# Patient Record
Sex: Male | Born: 1965 | Race: White | Hispanic: No | Marital: Married | Smoking: Current every day smoker
Health system: Southern US, Community
[De-identification: ages and names within clinical notes are randomized; demographics above are authoritative.]

## PROBLEM LIST (undated history)

## (undated) DIAGNOSIS — K219 Gastro-esophageal reflux disease without esophagitis: Secondary | ICD-10-CM

## (undated) DIAGNOSIS — E785 Hyperlipidemia, unspecified: Secondary | ICD-10-CM

## (undated) DIAGNOSIS — I10 Essential (primary) hypertension: Secondary | ICD-10-CM

## (undated) HISTORY — PX: HERNIA REPAIR: SHX51

## (undated) HISTORY — PX: TONSILLECTOMY: SHX5217

## (undated) HISTORY — DX: Gastro-esophageal reflux disease without esophagitis: K21.9

## (undated) HISTORY — PX: CATARACT EXTRACTION: SUR2

## (undated) HISTORY — DX: Hyperlipidemia, unspecified: E78.5

---

## 2009-05-02 ENCOUNTER — Ambulatory Visit (HOSPITAL_BASED_OUTPATIENT_CLINIC_OR_DEPARTMENT_OTHER): Admission: RE | Admit: 2009-05-02 | Discharge: 2009-05-02 | Payer: Self-pay | Admitting: Surgery

## 2009-12-01 ENCOUNTER — Ambulatory Visit (HOSPITAL_COMMUNITY): Admission: RE | Admit: 2009-12-01 | Discharge: 2009-12-01 | Payer: Self-pay | Admitting: Surgery

## 2009-12-01 HISTORY — PX: UMBILICAL HERNIA REPAIR: SHX196

## 2010-04-26 LAB — DIFFERENTIAL
Basophils Absolute: 0 10*3/uL (ref 0.0–0.1)
Basophils Relative: 0 % (ref 0–1)
Eosinophils Absolute: 0.4 10*3/uL (ref 0.0–0.7)
Monocytes Relative: 8 % (ref 3–12)
Neutrophils Relative %: 60 % (ref 43–77)

## 2010-04-26 LAB — BASIC METABOLIC PANEL
BUN: 10 mg/dL (ref 6–23)
CO2: 25 mEq/L (ref 19–32)
Calcium: 9.5 mg/dL (ref 8.4–10.5)
Creatinine, Ser: 0.92 mg/dL (ref 0.4–1.5)
GFR calc non Af Amer: >60 mL/min (ref >60)
Glucose, Bld: 105 mg/dL — ABNORMAL HIGH (ref 70–99)
Sodium: 134 mEq/L — ABNORMAL LOW (ref 135–145)

## 2010-04-26 LAB — CBC
Hemoglobin: 14.2 g/dL (ref 13.0–17.0)
MCH: 33 pg (ref 26.0–34.0)
MCHC: 35.1 g/dL (ref 30.0–36.0)
RDW: 12.6 % (ref 11.5–15.5)

## 2010-04-26 LAB — SURGICAL PCR SCREEN: MRSA, PCR: NEGATIVE

## 2010-05-07 LAB — BASIC METABOLIC PANEL
BUN: 11 mg/dL (ref 6–23)
CO2: 25 mEq/L (ref 19–32)
Calcium: 9.5 mg/dL (ref 8.4–10.5)
Chloride: 102 mEq/L (ref 96–112)
Creatinine, Ser: 0.9 mg/dL (ref 0.4–1.5)

## 2010-05-07 LAB — CBC
MCHC: 34.8 g/dL (ref 30.0–36.0)
MCV: 95.1 fL (ref 78.0–100.0)
Platelets: 240 10*3/uL (ref 150–400)
WBC: 6.6 10*3/uL (ref 4.0–10.5)

## 2010-05-07 LAB — DIFFERENTIAL
Basophils Relative: 1 % (ref 0–1)
Eosinophils Absolute: 0.9 10*3/uL — ABNORMAL HIGH (ref 0.0–0.7)
Neutrophils Relative %: 41 % — ABNORMAL LOW (ref 43–77)

## 2010-09-12 ENCOUNTER — Encounter (INDEPENDENT_AMBULATORY_CARE_PROVIDER_SITE_OTHER): Payer: Self-pay | Admitting: Surgery

## 2010-09-14 ENCOUNTER — Encounter (INDEPENDENT_AMBULATORY_CARE_PROVIDER_SITE_OTHER): Payer: Self-pay | Admitting: Surgery

## 2010-09-14 ENCOUNTER — Ambulatory Visit (INDEPENDENT_AMBULATORY_CARE_PROVIDER_SITE_OTHER): Payer: BC Managed Care – PPO | Admitting: Surgery

## 2010-09-14 VITALS — BP 152/102 | HR 68 | Temp 97.4°F | Ht 70.0 in | Wt 180.0 lb

## 2010-09-14 DIAGNOSIS — Z9889 Other specified postprocedural states: Secondary | ICD-10-CM

## 2010-09-14 DIAGNOSIS — Z8719 Personal history of other diseases of the digestive system: Secondary | ICD-10-CM

## 2010-09-14 DIAGNOSIS — K4090 Unilateral inguinal hernia, without obstruction or gangrene, not specified as recurrent: Secondary | ICD-10-CM | POA: Insufficient documentation

## 2010-09-14 NOTE — Progress Notes (Signed)
Status post bilateral inguinal hernia repairs with mesh and umbilical hernia repair with mesh in 2011. He did have a recurrence on the left which was repaired in November. The patient has been doing well. He comes in today concerned about some soreness that he is feeling in his right groin. This tends to occur after a long at work with a lot of heavy lifting. He denies any bulge.  On examination his umbilical incision is well healed with no sign of recurrent hernia. Right inguinal incision is well-healed with no sign of recurrent hernia with the patient standing and with Valsalva maneuver A left inguinal incision is well-healed with no sign of recurrent hernia with patient standing and with Valsalva maneuver  Impression: No sign of recurrent hernias Plan: Resume full activity. He may use p.r.n. ibuprofen or Aleve. Followup on a p.r.n. basis.

## 2011-05-26 ENCOUNTER — Emergency Department (HOSPITAL_COMMUNITY)
Admission: EM | Admit: 2011-05-26 | Discharge: 2011-05-27 | Disposition: A | Discharge status: Home / Self Care | Payer: BC Managed Care – PPO | Attending: Emergency Medicine | Admitting: Emergency Medicine

## 2011-05-26 ENCOUNTER — Encounter (HOSPITAL_COMMUNITY): Payer: Self-pay | Admitting: *Deleted

## 2011-05-26 DIAGNOSIS — X503XXA Overexertion from repetitive movements, initial encounter: Secondary | ICD-10-CM | POA: Insufficient documentation

## 2011-05-26 DIAGNOSIS — E785 Hyperlipidemia, unspecified: Secondary | ICD-10-CM | POA: Insufficient documentation

## 2011-05-26 DIAGNOSIS — Z79899 Other long term (current) drug therapy: Secondary | ICD-10-CM | POA: Insufficient documentation

## 2011-05-26 DIAGNOSIS — K219 Gastro-esophageal reflux disease without esophagitis: Secondary | ICD-10-CM | POA: Insufficient documentation

## 2011-05-26 DIAGNOSIS — R1031 Right lower quadrant pain: Secondary | ICD-10-CM | POA: Insufficient documentation

## 2011-05-26 DIAGNOSIS — J45909 Unspecified asthma, uncomplicated: Secondary | ICD-10-CM | POA: Insufficient documentation

## 2011-05-26 DIAGNOSIS — S301XXA Contusion of abdominal wall, initial encounter: Secondary | ICD-10-CM

## 2011-05-26 LAB — DIFFERENTIAL
Basophils Absolute: 0.1 10*3/uL (ref 0.0–0.1)
Eosinophils Relative: 3 % (ref 0–5)
Lymphocytes Relative: 19 % (ref 12–46)
Neutro Abs: 10.1 10*3/uL — ABNORMAL HIGH (ref 1.7–7.7)
Neutrophils Relative %: 73 % (ref 43–77)

## 2011-05-26 LAB — CBC
Platelets: 275 10*3/uL (ref 150–400)
RDW: 12.2 % (ref 11.5–15.5)
WBC: 13.8 10*3/uL — ABNORMAL HIGH (ref 4.0–10.5)

## 2011-05-26 LAB — COMPREHENSIVE METABOLIC PANEL
ALT: 16 U/L (ref 0–53)
AST: 33 U/L (ref 0–37)
CO2: 23 mEq/L (ref 19–32)
Calcium: 9.3 mg/dL (ref 8.4–10.5)
GFR calc non Af Amer: >90 mL/min (ref >90)
Sodium: 127 mEq/L — ABNORMAL LOW (ref 135–145)

## 2011-05-26 LAB — URINALYSIS, ROUTINE W REFLEX MICROSCOPIC
Leukocytes, UA: NEGATIVE
Nitrite: NEGATIVE
Protein, ur: NEGATIVE mg/dL
Urobilinogen, UA: 0.2 mg/dL (ref 0.0–1.0)

## 2011-05-26 MED ORDER — SODIUM CHLORIDE 0.9 % IV BOLUS (SEPSIS)
1000.0000 mL | Freq: Once | INTRAVENOUS | Status: AC
  Administered 2011-05-27: 1000 mL via INTRAVENOUS

## 2011-05-26 MED ORDER — SODIUM CHLORIDE 0.9 % IV SOLN
Freq: Once | INTRAVENOUS | Status: AC
  Administered 2011-05-27: via INTRAVENOUS

## 2011-05-26 NOTE — ED Provider Notes (Addendum)
History     CSN: [id number]  Arrival date & time 05/26/11  2243   First MD Initiated Contact with Patient 05/26/11 2320      Chief Complaint  Patient presents with  . Abdominal Pain    (Consider location/radiation/quality/duration/timing/severity/associated sxs/prior treatment) HPI Comments: Patient presents with right lower quadrant.  Umbilical pain that began on Thursday or Friday.  Patient notes that it has gradually worsened since that time.  He's had no associated nausea, vomiting or changes in bowel habits.  No anorexia.  He notes that it does get worse with physical exertion.  Patient has to do a lot of lifting and physical activity work and he noticed that it was more painful today.  No fevers.  He does have a prior abdominal surgery for a hernia.  No dysuria or hematuria.  No history of kidney stones.  Nothing specifically makes the pain better.  Patient is a 46 y.o. male presenting with abdominal pain. The history is provided by the patient. No language interpreter was used.  Abdominal Pain The primary symptoms of the illness include abdominal pain. The primary symptoms of the illness do not include fever, fatigue, shortness of breath, nausea, vomiting, diarrhea, hematemesis, hematochezia or dysuria. The current episode started more than 2 days ago. The onset of the illness was gradual. The problem has been gradually worsening.  The patient states that she believes she is currently not pregnant. The patient has not had a change in bowel habit. Symptoms associated with the illness do not include chills, anorexia, diaphoresis, heartburn, constipation, urgency, hematuria, frequency or back pain. Significant associated medical issues do not include diabetes.    Past Medical History  Diagnosis Date  . Hyperlipidemia   . GERD (gastroesophageal reflux disease)   . Asthma   . Cataract   . Inguinal hernia     bilateral    Past Surgical History  Procedure Date  . Umbilical hernia  repair 12/01/2009    recurrent left inguinal hernia  . Tonsillectomy   . Hernia repair     inguinal bilateral    History reviewed. No pertinent family history.  History  Substance Use Topics  . Smoking status: Current Everyday Smoker -- 0.5 packs/day  . Smokeless tobacco: Not on file  . Alcohol Use: 10.8 oz/week    18 Cans of beer per week      Review of Systems  Constitutional: Negative.  Negative for fever, chills, diaphoresis and fatigue.  HENT: Negative.   Eyes: Negative.  Negative for discharge and redness.  Respiratory: Negative.  Negative for cough and shortness of breath.   Cardiovascular: Negative.  Negative for chest pain.  Gastrointestinal: Positive for abdominal pain. Negative for heartburn, nausea, vomiting, diarrhea, constipation, hematochezia, anorexia and hematemesis.  Genitourinary: Negative.  Negative for dysuria, urgency, frequency and hematuria.  Musculoskeletal: Negative.  Negative for back pain.  Skin: Negative.  Negative for color change and rash.  Neurological: Negative for syncope and headaches.  Hematological: Negative.  Negative for adenopathy.  Psychiatric/Behavioral: Negative.  Negative for confusion.  All other systems reviewed and are negative.    Allergies  Review of patient's allergies indicates no known allergies.  Home Medications   Current Outpatient Rx  Name Route Sig Dispense Refill  . ALBUTEROL SULFATE HFA 108 (90 BASE) MCG/ACT IN AERS Inhalation Inhale 2 puffs into the lungs every 6 (six) hours as needed. For shortness of breath    . LIPITOR PO Oral Take by mouth daily.     Marland Kitchen  IBUPROFEN 200 MG PO TABS Oral Take 1,200 mg by mouth daily.    Marland Kitchen LISINOPRIL PO Oral Take 1 tablet by mouth daily.      BP 144/86  Pulse 94  Temp(Src) 98.3 F (36.8 C) (Oral)  Resp 16  SpO2 100%  Physical Exam  Nursing note and vitals reviewed. Constitutional: He is oriented to person, place, and time. He appears well-developed and well-nourished.   Non-toxic appearance. He does not have a sickly appearance.  HENT:  Head: Normocephalic and atraumatic.  Eyes: Conjunctivae, EOM and lids are normal. Pupils are equal, round, and reactive to light.  Neck: Trachea normal, normal range of motion and full passive range of motion without pain. Neck supple.  Cardiovascular: Normal rate, regular rhythm and normal heart sounds.   Pulmonary/Chest: Effort normal and breath sounds normal. No respiratory distress. He has no wheezes. He has no rales.  Abdominal: Soft. Normal appearance. He exhibits no distension. There is tenderness. There is no rebound and no CVA tenderness.       Periumbilical tenderness without rebound or guarding.  Musculoskeletal: Normal range of motion.  Neurological: He is alert and oriented to person, place, and time. He has normal strength.  Skin: Skin is warm, dry and intact. No rash noted.  Psychiatric: He has a normal mood and affect. His behavior is normal. Judgment and thought content normal.    ED Course  Procedures (including critical care time)  Results for orders placed during the hospital encounter of 05/26/11  URINALYSIS, ROUTINE W REFLEX MICROSCOPIC      Component Value Range   Color, Urine YELLOW  YELLOW    APPearance CLOUDY (*) CLEAR    Specific Gravity, Urine 1.013  1.005 - 1.030    pH 6.5  5.0 - 8.0    Glucose, UA NEGATIVE  NEGATIVE (mg/dL)   Hgb urine dipstick NEGATIVE  NEGATIVE    Bilirubin Urine NEGATIVE  NEGATIVE    Ketones, ur NEGATIVE  NEGATIVE (mg/dL)   Protein, ur NEGATIVE  NEGATIVE (mg/dL)   Urobilinogen, UA 0.2  0.0 - 1.0 (mg/dL)   Nitrite NEGATIVE  NEGATIVE    Leukocytes, UA NEGATIVE  NEGATIVE   CBC      Component Value Range   WBC 13.8 (*) 4.0 - 10.5 (K/uL)   RBC 4.12 (*) 4.22 - 5.81 (MIL/uL)   Hemoglobin 13.5  13.0 - 17.0 (g/dL)   HCT 16.1 (*) 09.6 - 52.0 (%)   MCV 91.7  78.0 - 100.0 (fL)   MCH 32.8  26.0 - 34.0 (pg)   MCHC 35.7  30.0 - 36.0 (g/dL)   RDW 04.5  40.9 - 81.1 (%)    Platelets 275  150 - 400 (K/uL)  DIFFERENTIAL      Component Value Range   Neutrophils Relative 73  43 - 77 (%)   Neutro Abs 10.1 (*) 1.7 - 7.7 (K/uL)   Lymphocytes Relative 19  12 - 46 (%)   Lymphs Abs 2.7  0.7 - 4.0 (K/uL)   Monocytes Relative 5  3 - 12 (%)   Monocytes Absolute 0.7  0.1 - 1.0 (K/uL)   Eosinophils Relative 3  0 - 5 (%)   Eosinophils Absolute 0.4  0.0 - 0.7 (K/uL)   Basophils Relative 1  0 - 1 (%)   Basophils Absolute 0.1  0.0 - 0.1 (K/uL)  COMPREHENSIVE METABOLIC PANEL      Component Value Range   Sodium 127 (*) 135 - 145 (mEq/L)   Potassium 3.7  3.5 - 5.1 (mEq/L)   Chloride 93 (*) 96 - 112 (mEq/L)   CO2 23  19 - 32 (mEq/L)   Glucose, Bld 114 (*) 70 - 99 (mg/dL)   BUN 11  6 - 23 (mg/dL)   Creatinine, Ser 1.61  0.50 - 1.35 (mg/dL)   Calcium 9.3  8.4 - 09.6 (mg/dL)   Total Protein 7.4  6.0 - 8.3 (g/dL)   Albumin 4.5  3.5 - 5.2 (g/dL)   AST 33  0 - 37 (U/L)   ALT 16  0 - 53 (U/L)   Alkaline Phosphatase 54  39 - 117 (U/L)   Total Bilirubin 0.5  0.3 - 1.2 (mg/dL)   GFR calc non Af Amer >90  >90 (mL/min)   GFR calc Af Amer >90  >90 (mL/min)   Ct Abdomen Pelvis W Contrast  05/27/2011  *RADIOLOGY REPORT*  Clinical Data: Right-sided abdominal pain.  CT ABDOMEN AND PELVIS WITH CONTRAST  Technique:  Multidetector CT imaging of the abdomen and pelvis was performed following the standard protocol during bolus administration of intravenous contrast.  Contrast: 40mL OMNIPAQUE IOHEXOL 300 MG/ML  SOLN, 80mL OMNIPAQUE IOHEXOL 300 MG/ML  SOLN  Comparison: None.  Findings: No acute findings at the lung bases.  Coronary artery atherosclerotic calcification is visualized. A smoothly marginated oval mass adjacent to the right heart border measures 3.5 x 2.2 cm axial dimension and measures water density.  This is most compatible with a pericardial cyst.  Visualized portion of the thoracic aorta is normal in caliber.  There is a focal mixed density, but predominately increased density mass  in the infraumbilical right rectus abdominus musculature. This measures up to approximately 65 HU and is fusiform.  This is consistent with a rectus abdominus muscle hematoma, and measures approximately 2.8 AP x 5.5 transverse  x 11 cm craniocaudal dimension. There is some extraperitoneal stranding deep to the hematoma.  No intraperitoneal blood or stranding is seen.  The liver, gallbladder, spleen, adrenal glands, kidneys, and pancreas are within normal limits.  The abdominal aorta is normal in caliber contains scattered atherosclerotic calcification.  The urinary bladder and prostate gland within normal limits.  The appendix is normal.  The stomach, small bowel loops, and colon are within normal limits.  No evidence of bowel wall thickening. There is no lymphadenopathy or free intraperitoneal air.  No acute bony abnormalities identified.  IMPRESSION:  1. Right rectus abdominus muscle hematoma.  Findings were discussed with Dr. Roseanne Reno by telephone 12:48 05/27/2011. 2.  Normal appendix. 3.  Coronary artery atherosclerotic calcification. 4.  Right pericardial cyst.  Original Report Authenticated By: Britta Mccreedy, M.D.      MDM  Patient with periumbilical and right lower quadrant pain for the last 3 days.  He notes it's been getting worse and does have tenderness on exam as well.  Given the patient's leukocytosis I will obtain a CT of his abdomen and pelvis to rule out appendicitis.  Nat Christen, MD 05/26/11 2337  The patient's CT scan demonstrates a rectus abdominal muscle hematoma.  Patient is hemodynamically stable here.  He is not on any anticoagulation or antiplatelet agents.  Patient has a normal hemoglobin as well.  Anticipate the patient will likely be able to be discharged as there does not appear to be active extravasation at this point in time but I will discuss the patient with general surgery for any further recommendations and possible followup as an outpatient.  Nat Christen,  MD 05/27/11 [phone]  Patient discussed with Dr. Dwain Sarna degrees patient needs conservative management.  No specific followup is needed.  He should minimize his lifting over the next few weeks.  I will discharge the patient home with instructions for light duty.  He can also use ice and pain medications to assist with his symptoms.  Nat Christen, MD 05/27/11 [phone]

## 2011-05-26 NOTE — ED Notes (Signed)
The pt has had rlq pain for 2-3 days no nv or diarrhea

## 2011-05-27 ENCOUNTER — Emergency Department (HOSPITAL_COMMUNITY): Payer: BC Managed Care – PPO

## 2011-05-27 MED ORDER — IOHEXOL 300 MG/ML  SOLN
40.0000 mL | Freq: Once | INTRAMUSCULAR | Status: AC | PRN
  Administered 2011-05-27: 40 mL via ORAL

## 2011-05-27 MED ORDER — HYDROCODONE-ACETAMINOPHEN 5-325 MG PO TABS: 1.0000 | ORAL_TABLET | ORAL | Status: AC | PRN

## 2011-05-27 MED ORDER — IOHEXOL 300 MG/ML  SOLN
80.0000 mL | Freq: Once | INTRAMUSCULAR | Status: AC | PRN
  Administered 2011-05-27: 80 mL via INTRAVENOUS

## 2011-05-27 NOTE — Discharge Instructions (Signed)
You have a rectus abdominal muscle hematoma.  You may place ice on your abdomen to assist with the pain.  You should minimize heavy lifting over the next 2-3 weeks.  The hematoma will decrease over time but it may take several weeks.  If you begin to have worsening pain, swelling or lightheadedness please seek reevaluation.  Hematoma A hematoma is a pocket of blood that collects under the skin, in an organ, in a body space, in a joint space, or in other tissue. The blood can clot to form a lump that you can see and feel. The lump is often firm, sore, and sometimes even painful and tender. Most hematomas get better in a few days to weeks. However, some hematomas may be serious and require medical care.Hematomas can range in size from very small to very large. CAUSES  A hematoma can be caused by a blunt or penetrating injury. It can also be caused by leakage from a blood vessel under the skin. Spontaneous leakage from a blood vessel is more likely to occur in elderly people, especially those taking blood thinners. Sometimes, a hematoma can develop after certain medical procedures. SYMPTOMS  Unlike a bruise, a hematoma forms a firm lump that you can feel. This lump is the collection of blood. The collection of blood can also cause your skin to turn a blue to dark blue color. If the hematoma is close to the surface of the skin, it often produces a yellowish color in the skin. DIAGNOSIS  Your caregiver can determine whether you have a hematoma based on your history and a physical exam. TREATMENT  Hematomas usually go away on their own over time. Rarely does the blood need to be drained out of the body. HOME CARE INSTRUCTIONS   Put ice on the injured area.   Put ice in a plastic bag.   Place a towel between your skin and the bag.   Leave the ice on for 15 to 20 minutes, 3 to 4 times a day for the first 1 to 2 days.   After the first 2 days, switch to using warm compresses on the hematoma.    Elevate the injured area to help decrease pain and swelling. Wrapping the area with an elastic bandage may also be helpful. Compression helps to reduce swelling and promotes shrinking of the hematoma. Make sure the bandage is not wrapped too tight.   If your hematoma is on a lower extremity and is painful, crutches may be helpful for a couple days.   Only take over-the-counter or prescription medicines for pain, discomfort, or fever as directed by your caregiver. Most patients can take acetaminophen or ibuprofen for the pain.  SEEK IMMEDIATE MEDICAL CARE IF:   You have increasing pain, or your pain is not controlled with medicine.   You have a fever.   You have worsening swelling or discoloration.   Your skin over the hematoma breaks or starts bleeding.  MAKE SURE YOU:   Understand these instructions.   Will watch your condition.   Will get help right away if you are not doing well or get worse.  Document Released: 09/12/2003 Document Revised: 01/17/2011 Document Reviewed: 10/01/2010 [facility] Patient Information 2012 [place], [place].

## 2011-08-02 ENCOUNTER — Encounter (INDEPENDENT_AMBULATORY_CARE_PROVIDER_SITE_OTHER): Payer: BC Managed Care – PPO | Admitting: Surgery

## 2012-12-04 ENCOUNTER — Encounter (INDEPENDENT_AMBULATORY_CARE_PROVIDER_SITE_OTHER): Payer: BC Managed Care – PPO | Admitting: Surgery

## 2013-03-08 ENCOUNTER — Ambulatory Visit (INDEPENDENT_AMBULATORY_CARE_PROVIDER_SITE_OTHER): Payer: BC Managed Care – PPO | Admitting: Surgery

## 2013-03-08 ENCOUNTER — Encounter (INDEPENDENT_AMBULATORY_CARE_PROVIDER_SITE_OTHER): Payer: Self-pay | Admitting: Surgery

## 2013-03-08 ENCOUNTER — Telehealth (INDEPENDENT_AMBULATORY_CARE_PROVIDER_SITE_OTHER): Payer: Self-pay | Admitting: General Surgery

## 2013-03-08 ENCOUNTER — Encounter (INDEPENDENT_AMBULATORY_CARE_PROVIDER_SITE_OTHER): Payer: Self-pay

## 2013-03-08 VITALS — BP 116/80 | HR 72 | Temp 97.8°F | Resp 14 | Ht 70.0 in | Wt 180.4 lb

## 2013-03-08 DIAGNOSIS — R1032 Left lower quadrant pain: Secondary | ICD-10-CM

## 2013-03-08 DIAGNOSIS — R109 Unspecified abdominal pain: Secondary | ICD-10-CM

## 2013-03-08 NOTE — Telephone Encounter (Signed)
Called back and spoke to the wife and I told her that Mr. Xavier Cooper has to see his PCP for something to claim him down when he has the test, that we don't do that here

## 2013-03-08 NOTE — Progress Notes (Signed)
Patient ID: Xavier Cooper, male   DOB: 1965/03/31, 48 y.o.   MRN: [id number]  Chief Complaint  Patient presents with  . New Evaluation    eval of abd wall hernia    HPI Xavier Cooper is a 48 y.o. male.  Self-referred for left groin pain  HPI This is a 48 year old male who presented in 2011 with bilateral inguinal hernias. These were repaired in open fashion with ultra pro mesh. Shortly after his operation he was working on his wife's car when he developed pain in his left groin. This area became larger and he had an obvious recurrence at his left groin. He also had a small umbilical hernia. In October of 2011 he underwent open repair of the recurrent left hernia as well as an umbilical hernia. He had indirect recurrence which was repaired by reducing the hernia sac and tightening the mesh around the spermatic cord. The umbilical hernia was primarily repaired. The patient has been doing well and has not been seen for over 3 years. About 4 months ago he began having some pain in his left groin. He reports no obvious bulge in this area but does feel discomfort after he has been standing on his feet for some time. He denies any obstructive symptoms.   Past Medical History  Diagnosis Date  . Hyperlipidemia   . GERD (gastroesophageal reflux disease)   . Asthma   . Cataract   . Inguinal hernia     bilateral    Past Surgical History  Procedure Laterality Date  . Umbilical hernia repair  12/01/2009    recurrent left inguinal hernia  . Tonsillectomy    . Hernia repair      inguinal bilateral  . Cataract extraction      History reviewed. No pertinent family history.  Social History History  Substance Use Topics  . Smoking status: Current Every Day Smoker -- 0.50 packs/day  . Smokeless tobacco: Never Used  . Alcohol Use: 10.8 oz/week    18 Cans of beer per week    No Known Allergies  Current Outpatient Prescriptions  Medication Sig Dispense Refill  . Atorvastatin Calcium  (LIPITOR PO) Take by mouth daily.       Marland Kitchen ibuprofen (ADVIL,MOTRIN) 200 MG tablet Take 1,200 mg by mouth daily.      Marland Kitchen LISINOPRIL PO Take 1 tablet by mouth daily.      Marland Kitchen albuterol (PROVENTIL HFA;VENTOLIN HFA) 108 (90 BASE) MCG/ACT inhaler Inhale 2 puffs into the lungs every 6 (six) hours as needed. For shortness of breath       No current facility-administered medications for this visit.    Review of Systems Review of Systems  Constitutional: Negative for fever, chills and unexpected weight change.  HENT: Negative for congestion, hearing loss, sore throat, trouble swallowing and voice change.   Eyes: Negative for visual disturbance.  Respiratory: Negative for cough and wheezing.   Cardiovascular: Negative for chest pain, palpitations and leg swelling.  Gastrointestinal: Positive for abdominal pain. Negative for nausea, vomiting, diarrhea, constipation, blood in stool, abdominal distention, anal bleeding and rectal pain.  Genitourinary: Positive for testicular pain. Negative for hematuria and difficulty urinating.  Musculoskeletal: Negative for arthralgias.  Skin: Negative for rash and wound.  Neurological: Negative for seizures, syncope, weakness and headaches.  Hematological: Negative for adenopathy. Does not bruise/bleed easily.  Psychiatric/Behavioral: Negative for confusion.    Blood pressure 116/80, pulse 72, temperature 97.8 F (36.6 C), temperature source Temporal, resp. rate 14, height 5'  10" (1.778 m), weight 180 lb 6.4 oz (81.829 kg).  Physical Exam Physical Exam WDWN in NAD GU:  Bilateral descended testes; no testicular masses; well-healed incisions; no obvious recurrent hernia on either side The area of tenderness is lateral to his incision on the left.  No obvious bulge Data Reviewed none  Assessment    Left groin pain - possible small recurrence of left inguinal hernia, but not obvious on examination     Plan    CT scan pelvis to evaluate for recurrence.  We  will see him back after the CT scan has been completed.        Cathern Tahir K. 03/08/2013, 1:43 PM

## 2013-03-08 NOTE — Telephone Encounter (Signed)
Message copied by Wilder GladeMCPHERSON, Malyssa Maris on Mon Mar 08, 2013  4:35 PM ------      Message from: Rise PaganiniFRYER, SHAPELL      Created: Mon Mar 08, 2013  4:23 PM      Regarding: Tsuei      Contact: [phone]       Patient was told to have a scan. He is unable to lie still for that amount of time. He will need something to help him during the scan. Please call to discuss. You can reach him at the home number also [phone]. Thank you. ------

## 2013-03-12 ENCOUNTER — Ambulatory Visit
Admission: RE | Admit: 2013-03-12 | Discharge: 2013-03-12 | Disposition: A | Discharge status: Home / Self Care | Payer: BC Managed Care – PPO | Source: Ambulatory Visit | Attending: Surgery | Admitting: Surgery

## 2013-03-12 DIAGNOSIS — R1032 Left lower quadrant pain: Secondary | ICD-10-CM

## 2013-03-12 MED ORDER — IOHEXOL 300 MG/ML  SOLN
100.0000 mL | Freq: Once | INTRAMUSCULAR | Status: AC | PRN
  Administered 2013-03-12: 100 mL via INTRAVENOUS

## 2013-03-19 ENCOUNTER — Encounter (INDEPENDENT_AMBULATORY_CARE_PROVIDER_SITE_OTHER): Payer: BC Managed Care – PPO | Admitting: Surgery

## 2013-04-05 ENCOUNTER — Ambulatory Visit
Admission: RE | Admit: 2013-04-05 | Discharge: 2013-04-05 | Disposition: A | Discharge status: Home / Self Care | Payer: BC Managed Care – PPO | Source: Ambulatory Visit | Attending: Internal Medicine | Admitting: Internal Medicine

## 2013-04-05 ENCOUNTER — Other Ambulatory Visit: Payer: Self-pay | Admitting: Internal Medicine

## 2013-04-05 DIAGNOSIS — R109 Unspecified abdominal pain: Secondary | ICD-10-CM

## 2013-04-16 ENCOUNTER — Encounter (INDEPENDENT_AMBULATORY_CARE_PROVIDER_SITE_OTHER): Payer: BC Managed Care – PPO | Admitting: Surgery

## 2013-06-04 ENCOUNTER — Encounter (INDEPENDENT_AMBULATORY_CARE_PROVIDER_SITE_OTHER): Payer: Self-pay | Admitting: Surgery

## 2013-06-04 ENCOUNTER — Ambulatory Visit (INDEPENDENT_AMBULATORY_CARE_PROVIDER_SITE_OTHER): Payer: BC Managed Care – PPO | Admitting: Surgery

## 2013-06-04 VITALS — BP 130/85 | HR 81 | Temp 98.6°F | Resp 16 | Ht 70.0 in | Wt 178.4 lb

## 2013-06-04 DIAGNOSIS — R1032 Left lower quadrant pain: Secondary | ICD-10-CM

## 2013-06-04 DIAGNOSIS — R109 Unspecified abdominal pain: Secondary | ICD-10-CM

## 2013-06-04 NOTE — Progress Notes (Signed)
Follow-up of his left groin pain after hernia repair in 2011.  The pain in his left groin has improved, but last week, he was lifting something heavy at work at felt some pain in his right groin.  No swelling on either side.  He underwent a CT scan of the pelvis on 03/12/13 that showed no sign of recurrent hernia.    CLINICAL DATA: Left groin pain with constipation for 4 months.  History of bilateral inguinal hernia repair in 2011 with recurrent  hernia repair on the left. No recent injury.  EXAM:  CT PELVIS WITH CONTRAST  TECHNIQUE:  Multidetector CT imaging of the pelvis was performed using the  standard protocol following the bolus administration of intravenous  contrast.  CONTRAST: 100mL OMNIPAQUE IOHEXOL 300 MG/ML SOLN  COMPARISON: Pelvic CT 05/27/2011.  FINDINGS:  There are resolving postsurgical changes in both inguinal regions.  No recurrent hernia or focal fluid collection is identified. The  right rectus sheath hematoma demonstrated previously has resolved.  There is an oval low-density mass between the coccyx and the rectum  which measures 4.2 x 3.0 x 5.0 cm. The surrounding fat planes are  intact, and there is no osseous erosion or extension to the  overlying subcutaneous tissues or skin. This appears slightly larger  than on the prior study.  The bladder, prostate gland and seminal vesicles appear stable.  There is no pelvic lymphadenopathy. There is mild distal aortic  atherosclerosis.  IMPRESSION:  1. No evidence of recurrent inguinal hernia or specific explanation  for recurrent left inguinal pain.  2. 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 This demonstrates no  aggressive characteristics or signs of infection although could  contribute to perirectal discomfort.  Electronically Signed  By: Roxy HorsemanBill Veazey M.D.  On: 03/12/2013 15:55      On examination, I cannot palpate a recurrent hernia on either side.  No  swelling or point tenderness noted.  My impression is that he experiences occasional muscle strain where the mesh is attached to the muscle.  No sign of recurrent hernia.  He may use NSAIDS PRN.  Follow-up PRN.  Wilmon ArmsMatthew K. Corliss Skainssuei, MD, [facility] CenterFACS [facility]  General/ Trauma Surgery  06/04/2013 4:46 PM

## 2013-08-10 ENCOUNTER — Emergency Department (HOSPITAL_COMMUNITY): Payer: BC Managed Care – PPO

## 2013-08-10 ENCOUNTER — Emergency Department (HOSPITAL_COMMUNITY)
Admission: EM | Admit: 2013-08-10 | Discharge: 2013-08-10 | Disposition: A | Discharge status: Home / Self Care | Payer: BC Managed Care – PPO | Attending: Emergency Medicine | Admitting: Emergency Medicine

## 2013-08-10 ENCOUNTER — Encounter (HOSPITAL_COMMUNITY): Payer: Self-pay | Admitting: Emergency Medicine

## 2013-08-10 DIAGNOSIS — R0789 Other chest pain: Secondary | ICD-10-CM

## 2013-08-10 DIAGNOSIS — Z8719 Personal history of other diseases of the digestive system: Secondary | ICD-10-CM | POA: Diagnosis not present

## 2013-08-10 DIAGNOSIS — Z8669 Personal history of other diseases of the nervous system and sense organs: Secondary | ICD-10-CM | POA: Insufficient documentation

## 2013-08-10 DIAGNOSIS — J45909 Unspecified asthma, uncomplicated: Secondary | ICD-10-CM | POA: Diagnosis not present

## 2013-08-10 DIAGNOSIS — F17200 Nicotine dependence, unspecified, uncomplicated: Secondary | ICD-10-CM | POA: Diagnosis not present

## 2013-08-10 DIAGNOSIS — Z79899 Other long term (current) drug therapy: Secondary | ICD-10-CM | POA: Diagnosis not present

## 2013-08-10 DIAGNOSIS — E785 Hyperlipidemia, unspecified: Secondary | ICD-10-CM | POA: Diagnosis not present

## 2013-08-10 DIAGNOSIS — R079 Chest pain, unspecified: Secondary | ICD-10-CM | POA: Insufficient documentation

## 2013-08-10 DIAGNOSIS — Z791 Long term (current) use of non-steroidal anti-inflammatories (NSAID): Secondary | ICD-10-CM | POA: Insufficient documentation

## 2013-08-10 LAB — I-STAT TROPONIN, ED: TROPONIN I, POC: 0 ng/mL (ref 0.00–0.08)

## 2013-08-10 LAB — CBC
HEMATOCRIT: 40.3 % (ref 39.0–52.0)
Hemoglobin: 13.7 g/dL (ref 13.0–17.0)
MCH: 31.4 pg (ref 26.0–34.0)
MCHC: 34 g/dL (ref 30.0–36.0)
MCV: 92.4 fL (ref 78.0–100.0)
Platelets: 257 10*3/uL (ref 150–400)
RBC: 4.36 MIL/uL (ref 4.22–5.81)
RDW: 12.1 % (ref 11.5–15.5)
WBC: 9.5 10*3/uL (ref 4.0–10.5)

## 2013-08-10 LAB — BASIC METABOLIC PANEL
BUN: 9 mg/dL (ref 6–23)
CHLORIDE: 92 meq/L — AB (ref 96–112)
CO2: 25 mEq/L (ref 19–32)
Calcium: 9.4 mg/dL (ref 8.4–10.5)
Creatinine, Ser: 0.8 mg/dL (ref 0.50–1.35)
GFR calc Af Amer: >90 mL/min (ref >90)
GFR calc non Af Amer: >90 mL/min (ref >90)
Glucose, Bld: 94 mg/dL (ref 70–99)
POTASSIUM: 4.5 meq/L (ref 3.7–5.3)
Sodium: 131 mEq/L — ABNORMAL LOW (ref 137–147)

## 2013-08-10 LAB — TROPONIN I

## 2013-08-10 MED ORDER — KETOROLAC TROMETHAMINE 30 MG/ML IJ SOLN
30.0000 mg | Freq: Once | INTRAMUSCULAR | Status: AC
  Administered 2013-08-10: 30 mg via INTRAMUSCULAR
  Filled 2013-08-10: qty 1

## 2013-08-10 MED ORDER — KETOROLAC TROMETHAMINE 30 MG/ML IJ SOLN: 30.0000 mg | Freq: Once | INTRAMUSCULAR | Status: DC

## 2013-08-10 NOTE — ED Provider Notes (Signed)
CSN: [id number]     Arrival date & time 08/10/13  1517 History   First MD Initiated Contact with Patient 08/10/13 1712     Chief Complaint  Patient presents with  . Chest Pain     (Consider location/radiation/quality/duration/timing/severity/associated sxs/prior Treatment) HPI Patient presents with chest pain.  Patient notes that he has had similar pain episodes with the past 3 days.  Each episode is left-sided, nonradiating, sore, momentary. There is no concurrent dyspnea, nausea, vomiting, dizziness. There is mild associated weakness and headache. There is no unilateral weakness, dysesthesia, confusion, disorientation. No exertional or pleuritic pain. Patient is generally well   Past Medical History  Diagnosis Date  . Hyperlipidemia   . GERD (gastroesophageal reflux disease)   . Asthma   . Cataract   . Inguinal hernia     bilateral   Past Surgical History  Procedure Laterality Date  . Umbilical hernia repair  12/01/2009    recurrent left inguinal hernia  . Tonsillectomy    . Hernia repair      inguinal bilateral  . Cataract extraction     History reviewed. No pertinent family history. History  Substance Use Topics  . Smoking status: Current Every Day Smoker -- 0.50 packs/day  . Smokeless tobacco: Never Used  . Alcohol Use: 10.8 oz/week    18 Cans of beer per week    Review of Systems  Constitutional:       Per HPI, otherwise negative  HENT:       Per HPI, otherwise negative  Respiratory:       Per HPI, otherwise negative  Cardiovascular:       Per HPI, otherwise negative  Gastrointestinal: Negative for vomiting.  Endocrine:       Negative aside from HPI  Genitourinary:       Neg aside from HPI   Musculoskeletal:       Per HPI, otherwise negative  Skin: Negative.   Neurological: Negative for syncope.      Allergies  Review of patient's allergies indicates no known allergies.  Home Medications   Prior to Admission medications   Medication Sig  Start Date End Date Taking? Authorizing Provider  albuterol (PROVENTIL HFA;VENTOLIN HFA) 108 (90 BASE) MCG/ACT inhaler Inhale 2 puffs into the lungs every 6 (six) hours as needed. For shortness of breath   Yes Historical Provider, MD  Atorvastatin Calcium (LIPITOR PO) Take by mouth daily.    Yes Historical Provider, MD  ibuprofen (ADVIL,MOTRIN) 200 MG tablet Take 1,200 mg by mouth daily.   Yes Historical Provider, MD  LISINOPRIL PO Take 1 tablet by mouth daily.   Yes Historical Provider, MD   BP 129/96  Pulse 74  Temp(Src) 98.6 F (37 C) (Oral)  Resp 15  Wt 178 lb 6 oz (80.91 kg)  SpO2 100% Physical Exam  Nursing note and vitals reviewed. Constitutional: He is oriented to person, place, and time. He appears well-developed. No distress.  HENT:  Head: Normocephalic and atraumatic.  Eyes: Conjunctivae and EOM are normal.  Cardiovascular: Normal rate, regular rhythm and intact distal pulses.   Pulmonary/Chest: Effort normal. No stridor. No respiratory distress.  Abdominal: He exhibits no distension.  Musculoskeletal: He exhibits no edema.  Neurological: He is alert and oriented to person, place, and time. No cranial nerve deficit. He exhibits normal muscle tone. Coordination normal.  Skin: Skin is warm and dry.  Psychiatric: He has a normal mood and affect.    ED Course  Procedures (including critical care  time) Labs Review Labs Reviewed  BASIC METABOLIC PANEL - Abnormal; Notable for the following:    Sodium 131 (*)    Chloride 92 (*)    All other components within normal limits  CBC  I-STAT TROPOININ, ED    Imaging Review Dg Chest 2 View  08/10/2013   CLINICAL DATA:  Left upper chest discomfort with history of asthma  EXAM: CHEST  2 VIEW  COMPARISON:  PA chest. Associated with a rib detail study of April 05, 2013  FINDINGS: The lungs are well-expanded and clear. The hemidiaphragms are not significantly flattened. The heart and pulmonary vascularity are normal. There is no  pleural effusion. There is a stable prominent epicardial fat pad anteriorly to the right of midline. The bony thorax is unremarkable.  IMPRESSION: There is no acute cardiopulmonary disease.   Electronically Signed   By: David  [place]   On: 08/10/2013 16:00     EKG Interpretation   Date/Time:  Tuesday August 10 2013 15:24:59 EDT Ventricular Rate:  81 PR Interval:  190 QRS Duration: 100 QT Interval:  358 QTC Calculation: 415 R Axis:   71 Text Interpretation:  Normal sinus rhythm Minimal voltage criteria for  LVH, may be normal variant Borderline ECG Sinus rhythm Left ventricular  hypertrophy No significant change since last tracing Abnormal ekg  Confirmed by Gerhard MunchLOCKWOOD, ROBERT  MD [phone] on 08/10/2013 5:50:40 PM     Update: On repeat exam the patient is awake alert, no ongoing complaints.  MDM  Patient presents with episodic chest pain for several days.  Absence of concurrent dyspnea, fever, chills is reassuring for the low suspicion of ongoing infection or thromboembolic processes. The patient does smoke, was counseled on the importance of not smoking. With 2 negative troponins, nonischemic EKG, there is low suspicion for occult ACS. Patient was discharged in stable condition with cardiology followup.   Gerhard Munchobert Lockwood, MD 08/10/13 2007

## 2013-08-10 NOTE — ED Notes (Signed)
Pt states that he is in no pain. Pain comes and goes

## 2013-08-10 NOTE — ED Notes (Signed)
Presents with 3 days of non radiating left sided chest pain described as dull ache, intermittent. Nothing makes pain better, nothing makes pain worse. Denies SOB, nausea and dizziness. Reports generalized weakness and headaches. Alert, oriented and MAE x4. Pain rated 3-4/10.

## 2013-08-10 NOTE — Discharge Instructions (Signed)
As discussed, your evaluation today has been largely reassuring.  But, it is important that you monitor your condition carefully, and do not hesitate to return to the ED if you develop new, or concerning changes in your condition. ° °Otherwise, please follow-up with your physician for appropriate ongoing care. ° °Chest Pain (Nonspecific) °It is often hard to give a diagnosis for the cause of chest pain. There is always a chance that your pain could be related to something serious, such as a heart attack or a blood clot in the lungs. You need to follow up with your doctor. °HOME CARE °· If antibiotic medicine was given, take it as directed by your doctor. Finish the medicine even if you start to feel better. °· For the next few days, avoid activities that bring on chest pain. Continue physical activities as told by your doctor. °· Do not use any tobacco products. This includes cigarettes, chewing tobacco, and e-cigarettes. °· Avoid drinking alcohol. °· Only take medicine as told by your doctor. °· Follow your doctor's suggestions for more testing if your chest pain does not go away. °· Keep all doctor visits you made. °GET HELP IF: °· Your chest pain does not go away, even after treatment. °· You have a rash with blisters on your chest. °· You have a fever. °GET HELP RIGHT AWAY IF:  °· You have more pain or pain that spreads to your arm, neck, jaw, back, or belly (abdomen). °· You have shortness of breath. °· You cough more than usual or cough up blood. °· You have very bad back or belly pain. °· You feel sick to your stomach (nauseous) or throw up (vomit). °· You have very bad weakness. °· You pass out (faint). °· You have chills. °This is an emergency. Do not wait to see if the problems will go away. Call your local emergency services (911 in [place] Do not drive yourself to the hospital. °MAKE SURE YOU:  °· Understand these instructions. °· Will watch your condition. °· Will get help right away if you are not doing  well or get worse. °Document Released: 07/17/2007 Document Revised: 02/02/2013 Document Reviewed: 07/17/2007 °ExitCare® Patient Information ©2015 [employer]. This information is not intended to replace advice given to you by your health care provider. Make sure you discuss any questions you have with your health care provider. ° °

## 2013-08-10 NOTE — ED Notes (Signed)
Onset Sunday intermittant left sided chest pain, lasts 15 min and has had 10 times a day since onset.  No other s/s noted with and without chest pain. Pt wants evaluated.

## 2018-05-09 ENCOUNTER — Emergency Department (HOSPITAL_COMMUNITY)
Admission: EM | Admit: 2018-05-09 | Discharge: 2018-05-09 | Disposition: A | Discharge status: Home / Self Care | Payer: BLUE CROSS/BLUE SHIELD | Attending: Emergency Medicine | Admitting: Emergency Medicine

## 2018-05-09 ENCOUNTER — Emergency Department (HOSPITAL_COMMUNITY): Payer: BLUE CROSS/BLUE SHIELD

## 2018-05-09 ENCOUNTER — Other Ambulatory Visit: Payer: Self-pay

## 2018-05-09 ENCOUNTER — Encounter (HOSPITAL_COMMUNITY): Payer: Self-pay | Admitting: Emergency Medicine

## 2018-05-09 DIAGNOSIS — Z79899 Other long term (current) drug therapy: Secondary | ICD-10-CM | POA: Insufficient documentation

## 2018-05-09 DIAGNOSIS — R109 Unspecified abdominal pain: Secondary | ICD-10-CM

## 2018-05-09 DIAGNOSIS — J45909 Unspecified asthma, uncomplicated: Secondary | ICD-10-CM | POA: Insufficient documentation

## 2018-05-09 DIAGNOSIS — F17210 Nicotine dependence, cigarettes, uncomplicated: Secondary | ICD-10-CM | POA: Diagnosis not present

## 2018-05-09 DIAGNOSIS — R1012 Left upper quadrant pain: Secondary | ICD-10-CM | POA: Insufficient documentation

## 2018-05-09 DIAGNOSIS — I10 Essential (primary) hypertension: Secondary | ICD-10-CM | POA: Insufficient documentation

## 2018-05-09 DIAGNOSIS — R0600 Dyspnea, unspecified: Secondary | ICD-10-CM

## 2018-05-09 DIAGNOSIS — R0602 Shortness of breath: Secondary | ICD-10-CM | POA: Diagnosis present

## 2018-05-09 HISTORY — DX: Essential (primary) hypertension: I10

## 2018-05-09 LAB — URINALYSIS, ROUTINE W REFLEX MICROSCOPIC
BACTERIA UA: NONE SEEN
BILIRUBIN URINE: NEGATIVE
Glucose, UA: NEGATIVE mg/dL
Hgb urine dipstick: NEGATIVE
KETONES UR: NEGATIVE mg/dL
LEUKOCYTE UA: NEGATIVE
Nitrite: NEGATIVE
Protein, ur: NEGATIVE mg/dL
SPECIFIC GRAVITY, URINE: 1.001 — AB (ref 1.005–1.030)
pH: 6 (ref 5.0–8.0)

## 2018-05-09 LAB — D-DIMER, QUANTITATIVE: D-Dimer, Quant: 0.52 ug/mL-FEU — ABNORMAL HIGH (ref 0.00–0.50)

## 2018-05-09 LAB — BASIC METABOLIC PANEL
Anion gap: 10 (ref 5–15)
BUN: 8 mg/dL (ref 6–20)
CO2: 22 mmol/L (ref 22–32)
Calcium: 9.3 mg/dL (ref 8.9–10.3)
Chloride: 93 mmol/L — ABNORMAL LOW (ref 98–111)
Creatinine, Ser: 0.74 mg/dL (ref 0.61–1.24)
GFR calc Af Amer: >60 mL/min (ref >60)
Glucose, Bld: 104 mg/dL — ABNORMAL HIGH (ref 70–99)
Potassium: 3.3 mmol/L — ABNORMAL LOW (ref 3.5–5.1)
Sodium: 125 mmol/L — ABNORMAL LOW (ref 135–145)

## 2018-05-09 LAB — CBC WITH DIFFERENTIAL/PLATELET
Abs Immature Granulocytes: 0.02 10*3/uL (ref 0.00–0.07)
Basophils Absolute: 0.1 10*3/uL (ref 0.0–0.1)
Basophils Relative: 1 %
EOS PCT: 4 %
Eosinophils Absolute: 0.4 10*3/uL (ref 0.0–0.5)
HCT: 39.3 % (ref 39.0–52.0)
HEMOGLOBIN: 13.6 g/dL (ref 13.0–17.0)
Immature Granulocytes: 0 %
LYMPHS PCT: 28 %
Lymphs Abs: 3.2 10*3/uL (ref 0.7–4.0)
MCH: 31.7 pg (ref 26.0–34.0)
MCHC: 34.6 g/dL (ref 30.0–36.0)
MCV: 91.6 fL (ref 80.0–100.0)
MONO ABS: 0.8 10*3/uL (ref 0.1–1.0)
MONOS PCT: 7 %
NEUTROS PCT: 60 %
NRBC: 0 % (ref 0.0–0.2)
Neutro Abs: 6.9 10*3/uL (ref 1.7–7.7)
Platelets: 293 10*3/uL (ref 150–400)
RBC: 4.29 MIL/uL (ref 4.22–5.81)
RDW: 11.7 % (ref 11.5–15.5)
WBC: 11.4 10*3/uL — ABNORMAL HIGH (ref 4.0–10.5)

## 2018-05-09 LAB — TROPONIN I: Troponin I: <0.03 ng/mL (ref <0.03)

## 2018-05-09 MED ORDER — IOHEXOL 350 MG/ML SOLN
100.0000 mL | Freq: Once | INTRAVENOUS | Status: AC | PRN
  Administered 2018-05-09: 100 mL via INTRAVENOUS

## 2018-05-09 NOTE — ED Notes (Signed)
Pt ambulatory to waiting room. Pt verbalized understanding of discharge instructions.   

## 2018-05-09 NOTE — ED Provider Notes (Signed)
[facility] EMERGENCY DEPARTMENT Provider Note   CSN: [id number] Arrival date & time: 05/09/18  1927    History   Chief Complaint Chief Complaint  Patient presents with  . Shortness of Breath    HPI Xavier Cooper is a 53 y.o. male.     The history is provided by the patient. No language interpreter was used.  Shortness of Breath  Severity:  Moderate Onset quality:  Gradual Timing:  Intermittent Progression:  Worsening Chronicity:  New Relieved by:  Nothing Worsened by:  Nothing Ineffective treatments:  None tried Associated symptoms: no chest pain   Pt complains of pain in left mid back.  Pt reports also shortness of breath.  Pt reports he has episodes of shortness of breath that come and go.   Past Medical History:  Diagnosis Date  . Asthma   . Cataract   . GERD (gastroesophageal reflux disease)   . Hyperlipidemia   . Hypertension   . Inguinal hernia    bilateral    Patient Active Problem List   Diagnosis Date Noted  . Left groin pain 03/08/2013  . Inguinal hernia     Past Surgical History:  Procedure Laterality Date  . CATARACT EXTRACTION    . HERNIA REPAIR     inguinal bilateral  . TONSILLECTOMY    . UMBILICAL HERNIA REPAIR  12/01/2009   recurrent left inguinal hernia        Home Medications    Prior to Admission medications   Medication Sig Start Date End Date Taking? Authorizing Provider  albuterol (PROVENTIL HFA;VENTOLIN HFA) 108 (90 BASE) MCG/ACT inhaler Inhale 2 puffs into the lungs every 6 (six) hours as needed. For shortness of breath    [provider]  Atorvastatin Calcium (LIPITOR PO) Take by mouth daily.     [provider]  ibuprofen (ADVIL,MOTRIN) 200 MG tablet Take 1,200 mg by mouth daily.    [provider]  LISINOPRIL PO Take 1 tablet by mouth daily.    [provider]    Family History No family history on file.  Social History Social History   Tobacco Use  . Smoking status:  Current Every Day Smoker    Packs/day: 0.50  . Smokeless tobacco: Never Used  Substance Use Topics  . Alcohol use: Yes    Alcohol/week: 6.0 standard drinks    Types: 6 Cans of beer per week    Comment: 6-7 beers per day   . Drug use: Not Currently    Types: Marijuana     Allergies   Patient has no known allergies.   Review of Systems Review of Systems  Respiratory: Positive for shortness of breath.   Cardiovascular: Negative for chest pain.  All other systems reviewed and are negative.    Physical Exam Updated Vital Signs BP (!) 152/99   Pulse 72   Temp 97.7 F (36.5 C) (Oral)   Resp 18   Ht 5\' 10"  (1.778 m)   Wt 79.4 kg   SpO2 100%   BMI 25.11 kg/m   Physical Exam Vitals signs and nursing note reviewed.  Constitutional:      Appearance: He is well-developed.  HENT:     Head: Normocephalic and atraumatic.     Mouth/Throat:     Mouth: Mucous membranes are moist.  Eyes:     Extraocular Movements: Extraocular movements intact.     Conjunctiva/sclera: Conjunctivae normal.     Pupils: Pupils are equal, round, and reactive to light.  Neck:     Musculoskeletal: Normal range of motion and neck supple.  Cardiovascular:     Rate and Rhythm: Normal rate and regular rhythm.     Heart sounds: No murmur.  Pulmonary:     Effort: Pulmonary effort is normal. No respiratory distress.     Breath sounds: Normal breath sounds. No decreased breath sounds.  Abdominal:     Palpations: Abdomen is soft.     Tenderness: There is no abdominal tenderness.  Musculoskeletal: Normal range of motion.  Skin:    General: Skin is warm and dry.  Neurological:     General: No focal deficit present.     Mental Status: He is alert.  Psychiatric:        Mood and Affect: Mood normal.      ED Treatments / Results  Labs (all labs ordered are listed, but only abnormal results are displayed) Labs Reviewed  URINALYSIS, ROUTINE W REFLEX MICROSCOPIC - Abnormal; Notable for the following  components:      Result Value   Color, Urine COLORLESS (*)    Specific Gravity, Urine 1.001 (*)    All other components within normal limits  CBC WITH DIFFERENTIAL/PLATELET - Abnormal; Notable for the following components:   WBC 11.4 (*)    All other components within normal limits  BASIC METABOLIC PANEL - Abnormal; Notable for the following components:   Sodium 125 (*)    Potassium 3.3 (*)    Chloride 93 (*)    Glucose, Bld 104 (*)    All other components within normal limits  D-DIMER, QUANTITATIVE (NOT AT [facility]) - Abnormal; Notable for the following components:   D-Dimer, Quant 0.52 (*)    All other components within normal limits  TROPONIN I    EKG EKG Interpretation  Date/Time:  Saturday May 09 2018 19:42:12 EDT Ventricular Rate:  78 PR Interval:    QRS Duration: 108 QT Interval:  393 QTC Calculation: 448 R Axis:   76 Text Interpretation:  Sinus rhythm Prolonged PR interval Minimal ST depression nonspecific t wave changes since prior tracing Confirmed by Linwood Dibbles [phone]) on 05/09/2018 7:56:36 PM   Radiology Ct Angio Chest Pe W And/or Wo Contrast  Result Date: 05/09/2018 CLINICAL DATA:  Shortness of breath, D-dimer EXAM: CT ANGIOGRAPHY CHEST WITH CONTRAST TECHNIQUE: Multidetector CT imaging of the chest was performed using the standard protocol during bolus administration of intravenous contrast. Multiplanar CT image reconstructions and MIPs were obtained to evaluate the vascular anatomy. CONTRAST:  OMNIPAQUE IOHEXOL 350 MG/ML SOLN COMPARISON:  Same day chest radiograph FINDINGS: Cardiovascular: Satisfactory opacification of the pulmonary arteries to the segmental level. No evidence of pulmonary embolism. LAD calcifications. Normal heart size. No pericardial effusion. 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 No enlarged mediastinal, hilar, or axillary lymph nodes. Thyroid gland, trachea,  and esophagus demonstrate no significant findings. Lungs/Pleura: Lungs are clear. No pleural effusion or pneumothorax. Upper Abdomen: No acute abnormality. Musculoskeletal: No chest wall abnormality. No acute or significant osseous findings. Review of the MIP images confirms the above findings. IMPRESSION: 1.  Negative examination for pulmonary embolism. 2.  Coronary artery disease. Electronically Signed   By: Lauralyn Primes M.D.   On: 05/09/2018 22:15   Dg Chest Port 1 View  Result Date: 05/09/2018 CLINICAL DATA:  Shortness of breath. EXAM: PORTABLE CHEST 1 VIEW COMPARISON:  08/10/2013 FINDINGS: The cardiomediastinal contours are normal for AP technique. 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 Mild bronchial thickening. Pulmonary vasculature is normal. No consolidation, pleural effusion, or pneumothorax. No acute osseous abnormalities are seen. IMPRESSION: Mild bronchial thickening suggesting asthma or acute bronchitis. Electronically Signed   By: Narda Rutherford M.D.   On: 05/09/2018 20:49    Procedures Procedures (including critical care time)  Medications Ordered in ED Medications  iohexol (OMNIPAQUE) 350 MG/ML injection 100 mL (100 mLs Intravenous Contrast Given 05/09/18 2156)     Initial Impression / Assessment and Plan / ED Course  I have reviewed the triage vital signs and the nursing notes.  Pertinent labs & imaging results that were available during my care of the patient were reviewed by me and considered in my medical decision making (see chart for details).        MDM  ua negative. No blood.  Pt has elevated ddimer.  Ct scan shows calcification LAD.  Pt counseled on results.  Pt advised to schedule evaluation with cardiology.    Final Clinical Impressions(s) / ED Diagnoses   Final diagnoses:  Dyspnea, unspecified type  Flank pain     ED Discharge Orders    None    An After Visit Summary was printed and  given to the patient.    Osie Cheeks 05/09/18 2322    Linwood Dibbles, MD 05/10/18 [phone]

## 2018-05-09 NOTE — ED Triage Notes (Signed)
Pain in lower LT back x 2 days ago.  Now feels SOB, denies fever, cough or body aches.

## 2018-05-11 ENCOUNTER — Telehealth: Payer: Self-pay

## 2018-05-11 NOTE — Telephone Encounter (Signed)
Patient was seen in the ER at [facility] by Elson Areas, PA-C for shortness of breath.  States that he has not had anymore symptoms since discharge. Patient was told to follow up with Cardiology.  # [phone].

## 2018-05-12 NOTE — Telephone Encounter (Signed)
I didn't get anything about this patient in a que yesterday.

## 2018-05-13 NOTE — Telephone Encounter (Signed)
Called patient asking that he return call to [facility] office, Need to schedule appointment .

## 2018-05-13 NOTE — Telephone Encounter (Signed)
Left patient a message to return our call to schedule appointment for them.

## 2018-07-16 ENCOUNTER — Encounter: Payer: Self-pay | Admitting: *Deleted

## 2018-09-17 ENCOUNTER — Ambulatory Visit (INDEPENDENT_AMBULATORY_CARE_PROVIDER_SITE_OTHER): Payer: Self-pay | Admitting: *Deleted

## 2018-09-17 ENCOUNTER — Other Ambulatory Visit: Payer: Self-pay

## 2018-09-17 DIAGNOSIS — Z1211 Encounter for screening for malignant neoplasm of colon: Secondary | ICD-10-CM

## 2018-09-17 MED ORDER — NA SULFATE-K SULFATE-MG SULF 17.5-3.13-1.6 GM/177ML PO SOLN: 1.0000 | Freq: Once | ORAL | 0 refills | Status: AC

## 2018-09-17 NOTE — Patient Instructions (Signed)
Xavier Cooper  March 03, 1965 MRN: [id number]     Procedure Date: 11/06/2018 Time to register: 12:00 pm Place to register: [facility] Procedure Time: 1:00 pm Scheduled provider: Dr. Gala Romney    PREPARATION FOR COLONOSCOPY WITH SUPREP BOWEL PREP KIT  Note: Suprep Bowel Prep Kit is a split-dose (2day) regimen. Consumption of BOTH 6-ounce bottles is required for a complete prep.  Please notify us immediately if you are diabetic, take iron supplements, or if you are on Coumadin or any other blood thinners.                                                                                                                                                  2 DAYS BEFORE PROCEDURE:  DATE: 11/04/2018   DAY: Wednesday Begin clear liquid diet AFTER your lunch meal. NO SOLID FOODS after this point.  1 DAY BEFORE PROCEDURE:  DATE: 11/05/2018   DAY: Thursday Continue clear liquids the entire day - NO SOLID FOOD.     At 6:00pm: Complete steps 1 through 4 below, using ONE (1) 6-ounce bottle, before going to bed. Step 1:  Pour ONE (1) 6-ounce bottle of SUPREP liquid into the mixing container.  Step 2:  Add cool drinking water to the 16 ounce line on the container and mix.  Note: Dilute the solution concentrate as directed prior to use. Step 3:  DRINK ALL the liquid in the container. Step 4:  You MUST drink an additional two (2) or more 16 ounce containers of water over the next one (1) hour.   Continue clear liquids.  DAY OF PROCEDURE:   DATE: 11/06/2018  DAY: Friday If you take medications for your heart, blood pressure, or breathing, you may take these medications.    5 hours before your procedure at:  8:00 am Step 1:  Pour ONE (1) 6-ounce bottle of SUPREP liquid into the mixing container.  Step 2:  Add cool drinking water to the 16 ounce line on the container and mix.  Note: Dilute the solution concentrate as directed prior to use. Step 3:  DRINK ALL the liquid in the container. Step 4:   You MUST drink an additional two (2) or more 16 ounce containers of water over the next one (1) hour. You MUST complete the final glass of water at least 3 hours before your colonoscopy. Nothing by mouth past 10:00 am  You may take your morning medications with sip of water unless we have instructed otherwise.    Please see below for Dietary Information.  CLEAR LIQUIDS INCLUDE:  Water Jello (NOT red in color)   Ice Popsicles (NOT red in color)   Tea (sugar ok, no milk/cream) Powdered fruit flavored drinks  Coffee (sugar ok, no milk/cream) Gatorade/ Lemonade/ Kool-Aid  (NOT red in color)   Juice: apple, white grape, white cranberry  Soft drinks  Clear bullion, consomme, broth (fat free beef/chicken/vegetable)  Carbonated beverages (any kind)  Strained chicken noodle soup Hard Candy   Remember: Clear liquids are liquids that will allow you to see your fingers on the other side of a clear glass. Be sure liquids are NOT red in color, and not cloudy, but CLEAR.  DO NOT EAT OR DRINK ANY OF THE FOLLOWING:  Dairy products of any kind   Cranberry juice Tomato juice / V8 juice   Grapefruit juice Orange juice     Red grape juice  Do not eat any solid foods, including such foods as: cereal, oatmeal, yogurt, fruits, vegetables, creamed soups, eggs, bread, crackers, pureed foods in a blender, etc.   HELPFUL HINTS FOR DRINKING PREP SOLUTION:   Make sure prep is extremely cold. Mix and refrigerate the the morning of the prep. You may also put in the freezer.   You may try mixing some Crystal Light or Country Time Lemonade if you prefer. Mix in small amounts; add more if necessary.  Try drinking through a straw  Rinse mouth with water or a mouthwash between glasses, to remove after-taste.  Try sipping on a cold beverage /ice/ popsicles between glasses of prep.  Place a piece of sugar-free hard candy in mouth between glasses.  If you become nauseated, try consuming smaller amounts, or stretch  out the time between glasses. Stop for 30-60 minutes, then slowly start back drinking.     OTHER INSTRUCTIONS  You will need a responsible adult at least 52 years of age to accompany you and drive you home. This person must remain in the waiting room during your procedure. The hospital will cancel your procedure if you do not have a responsible adult with you.   1. Wear loose fitting clothing that is easily removed. 2. Leave jewelry and other valuables at home.  3. Remove all body piercing jewelry and leave at home. 4. Total time from sign-in until discharge is approximately 2-3 hours. 5. You should go home directly after your procedure and rest. You can resume normal activities the day after your procedure. 6. The day of your procedure you should not:  Drive  Make legal decisions  Operate machinery  Drink alcohol  Return to work   You may call the office (Dept: [phone]) before 5:00pm, or page the doctor on call [phone]) after 5:00pm, for further instructions, if necessary.   Insurance Information YOU WILL NEED TO CHECK WITH YOUR INSURANCE COMPANY FOR THE BENEFITS OF COVERAGE YOU HAVE FOR THIS PROCEDURE.  UNFORTUNATELY, NOT ALL INSURANCE COMPANIES HAVE BENEFITS TO COVER ALL OR PART OF THESE TYPES OF PROCEDURES.  IT IS YOUR RESPONSIBILITY TO CHECK YOUR BENEFITS, HOWEVER, WE WILL BE GLAD TO ASSIST YOU WITH ANY CODES YOUR INSURANCE COMPANY MAY NEED.    PLEASE NOTE THAT MOST INSURANCE COMPANIES WILL NOT COVER A SCREENING COLONOSCOPY FOR PEOPLE UNDER THE AGE OF 50  IF YOU HAVE BC[employer] INSURANCE, YOU MAY HAVE BENEFITS FOR A SCREENING COLONOSCOPY BUT IF POLYPS ARE FOUND THE DIAGNOSIS WILL CHANGE AND THEN YOU MAY HAVE A DEDUCTIBLE THAT WILL NEED TO BE MET. SO PLEASE MAKE SURE YOU CHECK YOUR BENEFITS FOR A SCREENING COLONOSCOPY AS WELL AS A DIAGNOSTIC COLONOSCOPY.

## 2018-09-17 NOTE — Progress Notes (Signed)
Gastroenterology Pre-Procedure Review  Request Date: 09/17/2018 Requesting Physician: Dr. Seward Carol with [facility] @ [facility], no previous TCS  PATIENT REVIEW QUESTIONS: The patient responded to the following health history questions as indicated:    1. Diabetes Melitis: No 2. Joint replacements in the past 12 months: No 3. Major health problems in the past 3 months: No 4. Has an artificial valve or MVP: No 5. Has a defibrillator: No 6. Has been advised in past to take antibiotics in advance of a procedure like teeth cleaning: No 7. Family history of colon cancer: No  8. Alcohol Use: Yes, 3 or 4 beers every other day 9. History of sleep apnea: No  10. History of coronary artery or other vascular stents placed within the last 12 months: No 11. History of any prior anesthesia complications: No    MEDICATIONS & ALLERGIES:    Patient reports the following regarding taking any blood thinners:   Plavix? No Aspirin? No Coumadin? No Brilinta? No Xarelto? No Eliquis? No Pradaxa? No Savaysa? No Effient? No  Patient confirms/reports the following medications:  Current Outpatient Medications  Medication Sig Dispense Refill  . albuterol (PROVENTIL HFA;VENTOLIN HFA) 108 (90 BASE) MCG/ACT inhaler Inhale 2 puffs into the lungs every 6 (six) hours as needed. For shortness of breath    . ibuprofen (ADVIL,MOTRIN) 200 MG tablet Take 1,200 mg by mouth as needed.     Marland Kitchen LISINOPRIL PO Take 1 tablet by mouth daily. Takes 10 mg daily.     No current facility-administered medications for this visit.     Patient confirms/reports the following allergies:  No Known Allergies  No orders of the defined types were placed in this encounter.   AUTHORIZATION INFORMATION Primary Insurance: [place],  [place] #: [id number],  Group #: [id number] Pre-Cert / Josem Kaufmann required: No, file to local BCBS   SCHEDULE INFORMATION: Procedure has been scheduled as follows:  Date: 11/06/2018, Time: 1:00 Location:  [facility] with Dr. Gala Romney  This Gastroenterology Pre-Precedure Review Form is being routed to the following provider(s): Neil Crouch, PA-C

## 2018-09-18 NOTE — Progress Notes (Signed)
Needs OV for propofol due to etoh use.

## 2018-09-21 NOTE — Progress Notes (Signed)
SCHEDULED PATIENT.

## 2018-09-23 ENCOUNTER — Encounter: Payer: Self-pay | Admitting: *Deleted

## 2018-09-23 NOTE — Progress Notes (Signed)
Letter mailed to pt to inform him of appointment and procedure cancellation.

## 2018-10-21 ENCOUNTER — Ambulatory Visit: Payer: BLUE CROSS/BLUE SHIELD | Admitting: Gastroenterology

## 2018-11-04 ENCOUNTER — Other Ambulatory Visit (HOSPITAL_COMMUNITY): Payer: BLUE CROSS/BLUE SHIELD
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
## 2018-11-23 ENCOUNTER — Ambulatory Visit: Payer: BLUE CROSS/BLUE SHIELD | Admitting: Cardiovascular Disease

## 2018-11-23 ENCOUNTER — Encounter: Payer: Self-pay | Admitting: Cardiovascular Disease

## 2018-11-23 ENCOUNTER — Other Ambulatory Visit: Payer: Self-pay

## 2018-11-23 DIAGNOSIS — R079 Chest pain, unspecified: Secondary | ICD-10-CM

## 2018-11-23 NOTE — Patient Instructions (Signed)
Medication Instructions:  Your physician recommends that you continue on your current medications as directed. Please refer to the Current Medication list given to you today.    Labwork: NONE  Testing/Procedures: Your physician has requested that you have en exercise stress myoview. For further information please visit [email or web address]. Please follow instruction sheet, as given.    Follow-Up: Your physician recommends that you schedule a follow-up appointment in: AS NEEDED    Any Other Special Instructions Will Be Listed Below (If Applicable).     If you need a refill on your cardiac medications before your next appointment, please call your pharmacy.   

## 2018-11-24 ENCOUNTER — Ambulatory Visit: Payer: BLUE CROSS/BLUE SHIELD | Admitting: Gastroenterology

## 2018-11-30 ENCOUNTER — Encounter (HOSPITAL_COMMUNITY): Payer: BLUE CROSS/BLUE SHIELD

## 2018-12-04 ENCOUNTER — Other Ambulatory Visit (HOSPITAL_COMMUNITY)
Admission: RE | Admit: 2018-12-04 | Discharge: 2018-12-04 | Disposition: A | Discharge status: Home / Self Care | Payer: BLUE CROSS/BLUE SHIELD | Source: Ambulatory Visit | Attending: Cardiovascular Disease | Admitting: Cardiovascular Disease

## 2018-12-04 ENCOUNTER — Other Ambulatory Visit: Payer: Self-pay

## 2018-12-07 ENCOUNTER — Encounter (HOSPITAL_COMMUNITY): Payer: BLUE CROSS/BLUE SHIELD

## 2018-12-07 ENCOUNTER — Other Ambulatory Visit (HOSPITAL_COMMUNITY): Payer: BLUE CROSS/BLUE SHIELD

## 2021-02-21 IMAGING — CT CT ANGIOGRAPHY CHEST
2 of 6 series · 18 of 46 positions shown · IV contrast (Isovue)
Comparison: Same day chest radiograph

CLINICAL DATA: Shortness of breath, D-dimer

EXAM:
CT ANGIOGRAPHY CHEST WITH CONTRAST
TECHNIQUE: Multidetector CT imaging of the chest was performed using the
standard protocol during bolus administration of intravenous
contrast. Multiplanar CT image reconstructions and MIPs were
obtained to evaluate the vascular anatomy.
CONTRAST:  100mL OMNIPAQUE IOHEXOL 350 MG/ML SOLN

[Series 5: thins · axial · 0.75mm/px · z∈[+964,+1253]mm · 15 of 317 slices shown]
[im 14/317  lung]
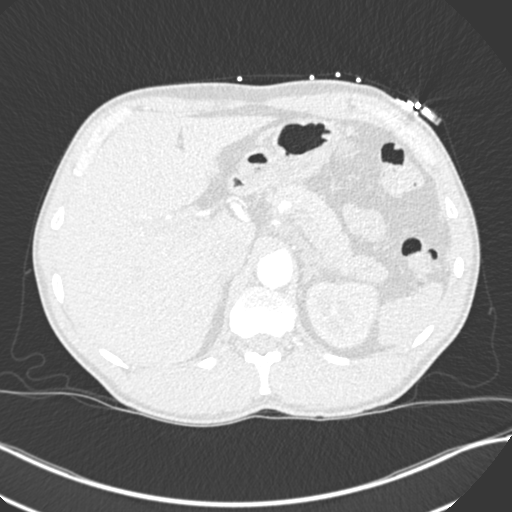
[im 42/317  soft-tissue]
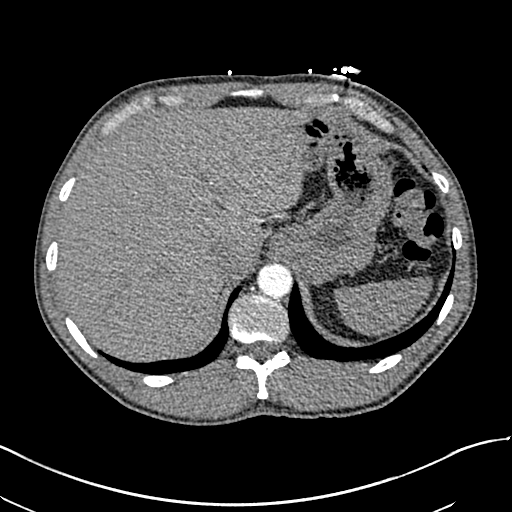
[im 55/317  lung]
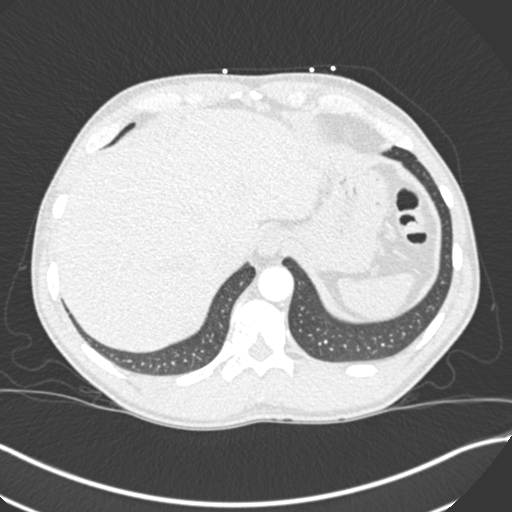
[im 83/317  soft-tissue]
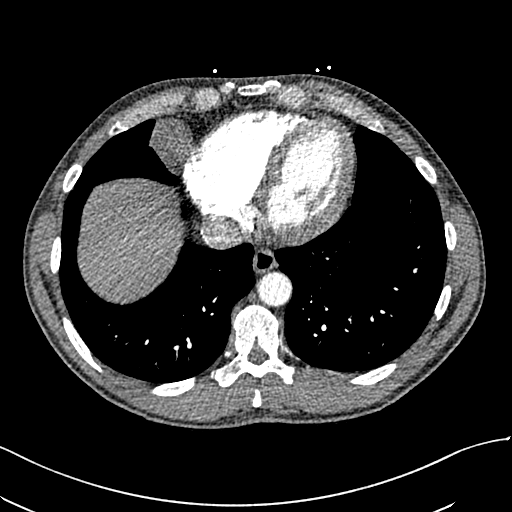
[im 97/317  lung]
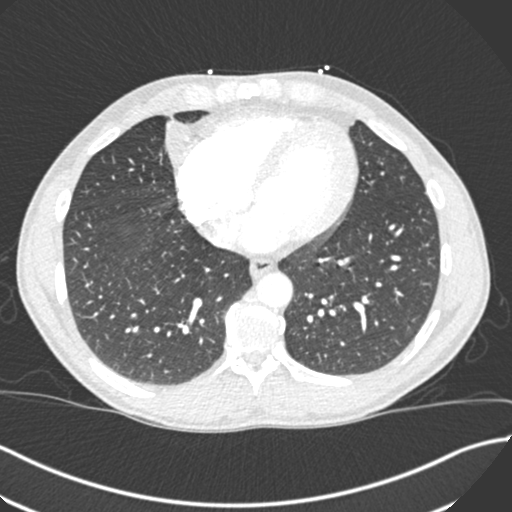
[im 124/317  soft-tissue]
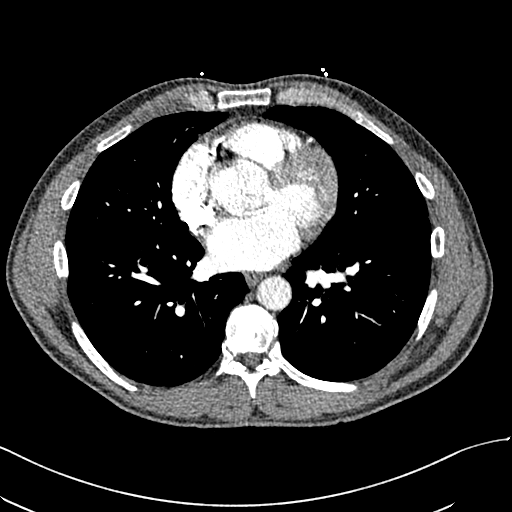
[im 138/317  lung]
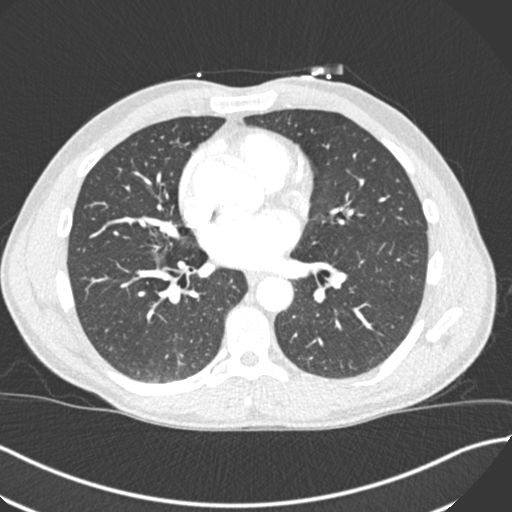
[im 165/317  soft-tissue]
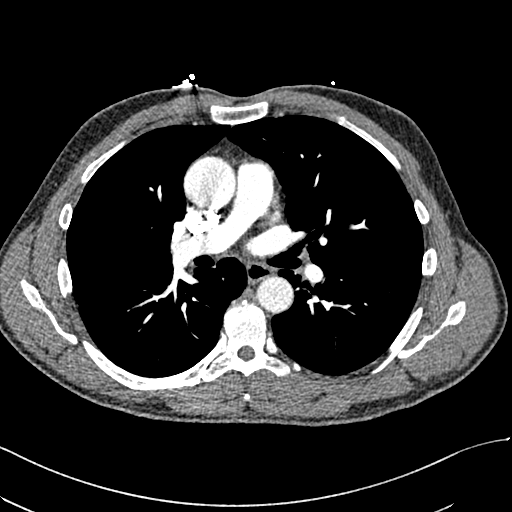
[im 179/317  lung]
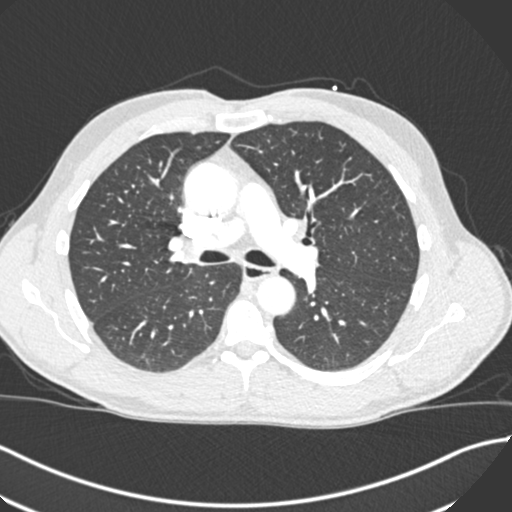
[im 193/317  soft-tissue]
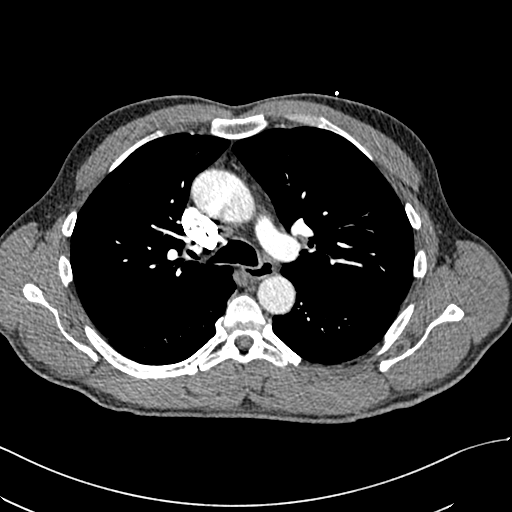
[im 220/317  lung]
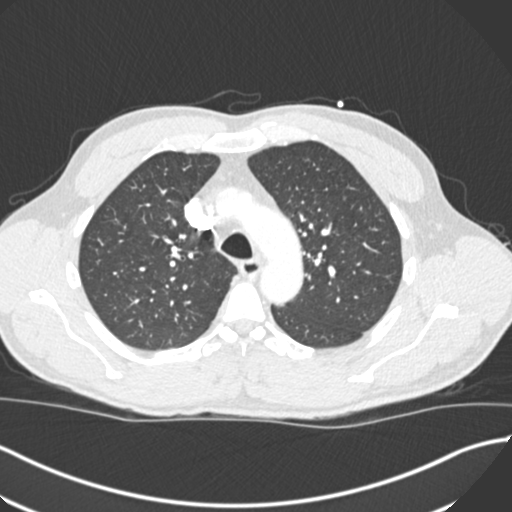
[im 234/317  soft-tissue]
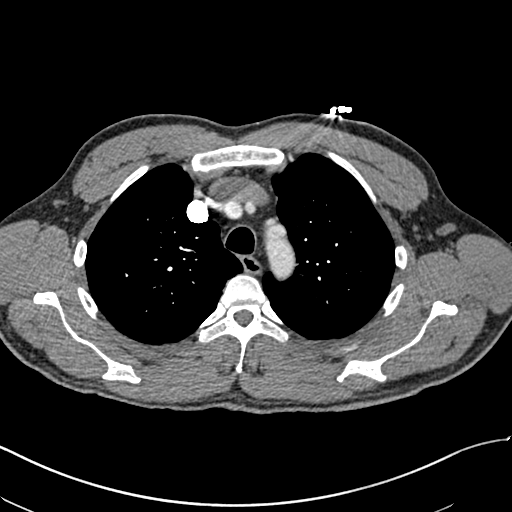
[im 262/317  lung]
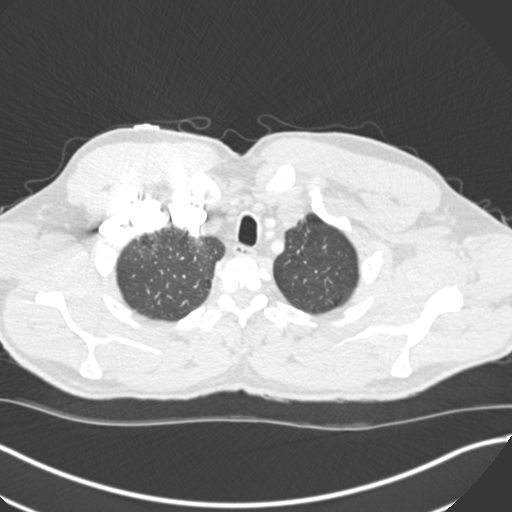
[im 275/317  soft-tissue]
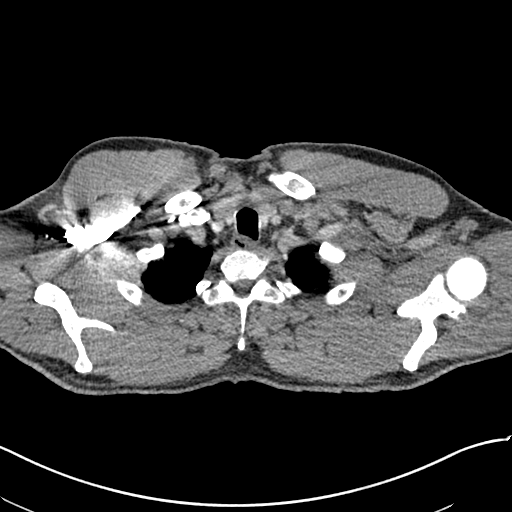
[im 303/317  lung]
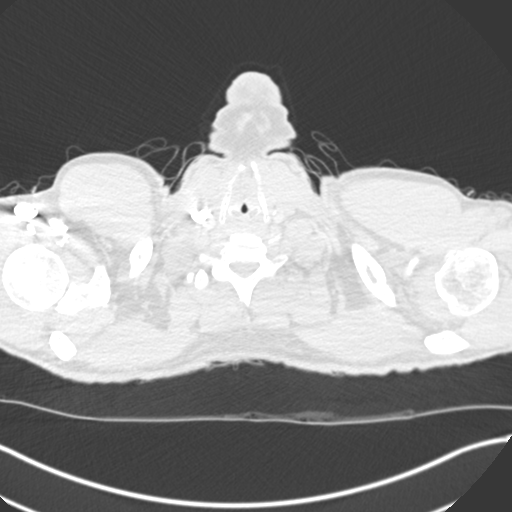

[Series 7: coronal mpr · coronal · 0.62mm/px · 3 of 147 slices shown]
[im 37/147  soft-tissue]
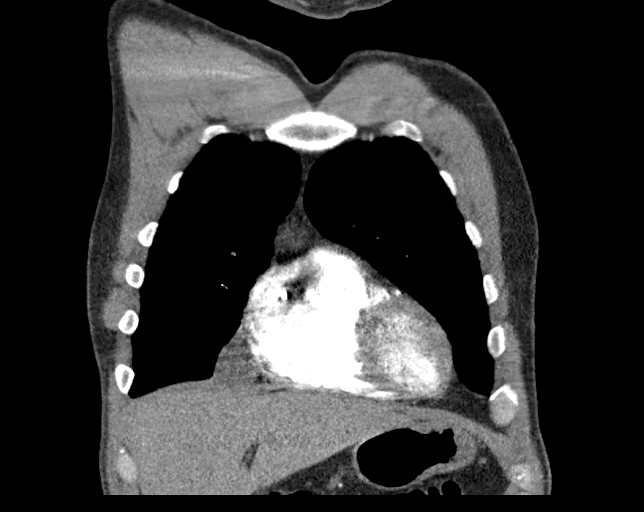
[im 74/147  soft-tissue]
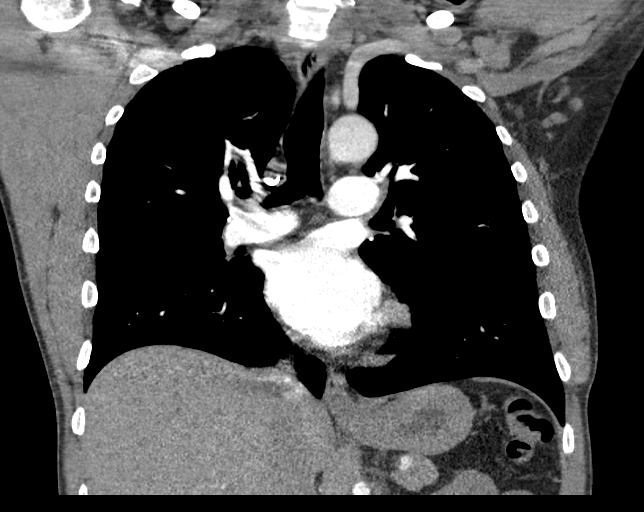
[im 110/147  soft-tissue]
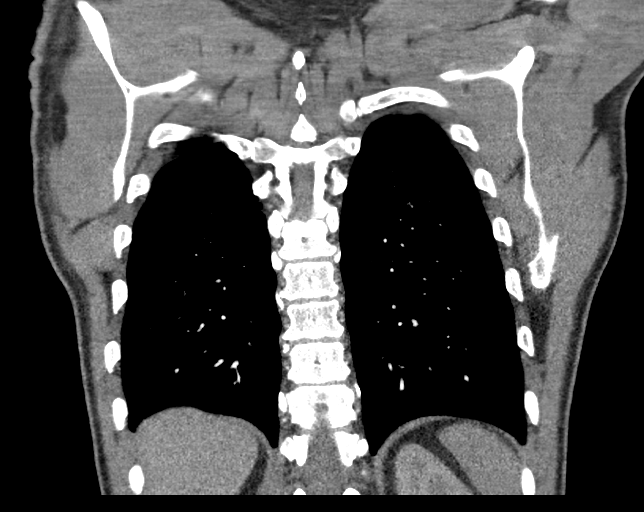

[18 of 46 positions shown; findings below may reference images not displayed]

FINDINGS: Cardiovascular: Satisfactory opacification of the pulmonary arteries
to the segmental level. No evidence of pulmonary embolism. LAD
calcifications. Normal heart size. No pericardial effusion.

Mediastinum/Nodes: Fluid attenuation, probable pericardial
duplication cyst abutting the right atrium (series 4, image 79). No
enlarged mediastinal, hilar, or axillary lymph nodes. Thyroid gland,
trachea, and esophagus demonstrate no significant findings.

Lungs/Pleura: Lungs are clear. No pleural effusion or pneumothorax.

Upper Abdomen: No acute abnormality.

Musculoskeletal: No chest wall abnormality. No acute or significant
osseous findings.

Review of the MIP images confirms the above findings.
IMPRESSION: 1.  Negative examination for pulmonary embolism.

2.  Coronary artery disease.

## 2022-08-30 ENCOUNTER — Encounter (HOSPITAL_COMMUNITY): Payer: Self-pay | Admitting: *Deleted

## 2022-08-30 ENCOUNTER — Emergency Department (HOSPITAL_COMMUNITY)
Admission: EM | Admit: 2022-08-30 | Discharge: 2022-08-30 | Disposition: A | Discharge status: Home / Self Care | Payer: BLUE CROSS/BLUE SHIELD | Source: Home / Self Care | Attending: Emergency Medicine | Admitting: Emergency Medicine

## 2022-08-30 ENCOUNTER — Other Ambulatory Visit: Payer: Self-pay

## 2022-08-30 DIAGNOSIS — I10 Essential (primary) hypertension: Secondary | ICD-10-CM | POA: Diagnosis not present

## 2022-08-30 DIAGNOSIS — E860 Dehydration: Secondary | ICD-10-CM | POA: Diagnosis not present

## 2022-08-30 DIAGNOSIS — R42 Dizziness and giddiness: Secondary | ICD-10-CM | POA: Diagnosis present

## 2022-08-30 DIAGNOSIS — R55 Syncope and collapse: Secondary | ICD-10-CM

## 2022-08-30 DIAGNOSIS — Z79899 Other long term (current) drug therapy: Secondary | ICD-10-CM | POA: Insufficient documentation

## 2022-08-30 LAB — URINALYSIS, ROUTINE W REFLEX MICROSCOPIC
Bilirubin Urine: NEGATIVE
Glucose, UA: NEGATIVE mg/dL
Hgb urine dipstick: NEGATIVE
Ketones, ur: NEGATIVE mg/dL
Leukocytes,Ua: NEGATIVE
Nitrite: NEGATIVE
Protein, ur: NEGATIVE mg/dL
Specific Gravity, Urine: 1.008 (ref 1.005–1.030)
pH: 7 (ref 5.0–8.0)

## 2022-08-30 LAB — CBG MONITORING, ED: Glucose-Capillary: 167 mg/dL — ABNORMAL HIGH (ref 70–99)

## 2022-08-30 LAB — CBC
HCT: 45.7 % (ref 39.0–52.0)
Hemoglobin: 15.2 g/dL (ref 13.0–17.0)
MCH: 32 pg (ref 26.0–34.0)
MCHC: 33.3 g/dL (ref 30.0–36.0)
MCV: 96.2 fL (ref 80.0–100.0)
Platelets: 348 10*3/uL (ref 150–400)
RBC: 4.75 MIL/uL (ref 4.22–5.81)
RDW: 12.7 % (ref 11.5–15.5)
WBC: 22.6 10*3/uL — ABNORMAL HIGH (ref 4.0–10.5)
nRBC: 0 % (ref 0.0–0.2)

## 2022-08-30 LAB — ETHANOL: Alcohol, Ethyl (B): <10 mg/dL (ref <10)

## 2022-08-30 LAB — BASIC METABOLIC PANEL
Anion gap: 12 (ref 5–15)
BUN: 16 mg/dL (ref 6–20)
CO2: 20 mmol/L — ABNORMAL LOW (ref 22–32)
Calcium: 8.8 mg/dL — ABNORMAL LOW (ref 8.9–10.3)
Chloride: 100 mmol/L (ref 98–111)
Creatinine, Ser: 1.28 mg/dL — ABNORMAL HIGH (ref 0.61–1.24)
GFR, Estimated: >60 mL/min (ref >60)
Glucose, Bld: 116 mg/dL — ABNORMAL HIGH (ref 70–99)
Potassium: 4.4 mmol/L (ref 3.5–5.1)
Sodium: 132 mmol/L — ABNORMAL LOW (ref 135–145)

## 2022-08-30 LAB — TSH: TSH: 1.713 u[IU]/mL (ref 0.350–4.500)

## 2022-08-30 LAB — PHOSPHORUS: Phosphorus: 3.7 mg/dL (ref 2.5–4.6)

## 2022-08-30 LAB — MAGNESIUM: Magnesium: 1.8 mg/dL (ref 1.7–2.4)

## 2022-08-30 MED ORDER — SODIUM CHLORIDE 0.9 % IV BOLUS
1000.0000 mL | Freq: Once | INTRAVENOUS | Status: AC
  Administered 2022-08-30: 1000 mL via INTRAVENOUS

## 2022-08-30 NOTE — ED Triage Notes (Signed)
Patient presents to ed via [facility] states he was at work and had a near syncopal episode c/o feeling lightheaded. Denies pain. Patient was given 324 mg ASA per ems. pale

## 2022-08-30 NOTE — ED Provider Notes (Signed)
[place] [facility] DEPARTMENT AT [facility] Provider Note   CSN: [id number] Arrival date & time: 08/30/22  3086     History  Chief Complaint  Patient presents with   Dizziness    Xavier Cooper is a 57 y.o. male.  The history is provided by the patient, the EMS personnel and medical records. No language interpreter was used.  Dizziness    57 year old male with hx of HTN on lisinopril BIB EMS from work for evaluation of near syncope.  Pt report he felt fine yesterday and this morning when he arrive at work.  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  He went out to his car to rest for a bit, went back to work and shortly later he felt lightheadedness again and had a near syncope.  Coworker contact EMS and pt was brought here.  EMR report pt was hypotensive, improves with IVF.  Initial CBG >100.  Pt denies any headache, neck pain, cp, heart palpitation, sob, abd pain, focal numbness or focal weakness.  Admits to tobacco use, half a pack a day and alcohol use, 6-8 beers daily.  Last alcohol use was last night.  Does not normally eat breakfast. Report improvement with IVF.   Home Medications Prior to Admission medications   Medication Sig Start Date End Date Taking? Authorizing Provider  albuterol (PROVENTIL HFA;VENTOLIN HFA) 108 (90 BASE) MCG/ACT inhaler Inhale 2 puffs into the lungs every 6 (six) hours as needed. For shortness of breath    [provider]  ibuprofen (ADVIL,MOTRIN) 200 MG tablet Take 1,200 mg by mouth as needed.     [provider]  LISINOPRIL PO Take 1 tablet by mouth daily. Takes 10 mg daily.    [provider]      Allergies    Patient has no known allergies.    Review of Systems   Review of Systems  Neurological:  Positive for dizziness.  All other systems reviewed and are negative.   Physical Exam Updated Vital Signs BP (!) 63/47 (BP Location: Right Arm)    Pulse 71   Resp 18   Ht 5\' 10"  (1.778 m)   Wt 86.2 kg   SpO2 100%   BMI 27.26 kg/m  Physical Exam Vitals and nursing note reviewed.  Constitutional:      General: He is not in acute distress.    Appearance: He is well-developed.  HENT:     Head: Normocephalic and atraumatic.  Eyes:     Extraocular Movements: Extraocular movements intact.     Conjunctiva/sclera: Conjunctivae normal.     Pupils: Pupils are equal, round, and reactive to light.  Cardiovascular:     Rate and Rhythm: Normal rate and regular rhythm.     Pulses: Normal pulses.     Heart sounds: Normal heart sounds.  Pulmonary:     Effort: Pulmonary effort is normal.     Breath sounds: Normal breath sounds.  Abdominal:     General: Abdomen is flat.     Palpations: Abdomen is soft.     Tenderness: There is no abdominal tenderness.  Musculoskeletal:     Cervical back: Neck supple.  Skin:    Findings: No rash.  Neurological:     Mental Status: He is alert and oriented to person, place, and time.     GCS: GCS eye subscore is 4. GCS verbal subscore is 5. GCS motor subscore is  6.     Cranial Nerves: Cranial nerves 2-12 are intact.     Sensory: Sensation is intact.     Motor: Motor function is intact.     Coordination: Coordination is intact.     ED Results / Procedures / Treatments   Labs (all labs ordered are listed, but only abnormal results are displayed) Labs Reviewed  CBC - Abnormal; Notable for the following components:      Result Value   WBC 22.6 (*)    All other components within normal limits  URINALYSIS, ROUTINE W REFLEX MICROSCOPIC - Abnormal; Notable for the following components:   Color, Urine STRAW (*)    All other components within normal limits  BASIC METABOLIC PANEL - Abnormal; Notable for the following components:   Sodium 132 (*)    CO2 20 (*)    Glucose, Bld 116 (*)    Creatinine, Ser 1.28 (*)    Calcium 8.8 (*)    All other components within normal limits  CBG MONITORING, ED -  Abnormal; Notable for the following components:   Glucose-Capillary 167 (*)    All other components within normal limits  ETHANOL  TSH  MAGNESIUM  PHOSPHORUS    EKG None  Date: 08/30/2022  Rate: 87  Rhythm: normal sinus rhythm  QRS Axis: normal  Intervals: normal  ST/T Wave abnormalities: normal  Conduction Disutrbances: none  Narrative Interpretation:   Old EKG Reviewed: No significant changes noted    Radiology No results found.  Procedures Procedures    Medications Ordered in ED Medications  sodium chloride 0.9 % bolus 1,000 mL (0 mLs Intravenous Stopped 08/30/22 1318)    ED Course/ Medical Decision Making/ A&P                             Medical Decision Making Amount and/or Complexity of Data Reviewed Labs: ordered.   BP (!) 63/47 (BP Location: Right Arm)   Pulse 71   Resp 18   Ht 5\' 10"  (1.778 m)   Wt 86.2 kg   SpO2 100%   BMI 27.26 kg/m   50:69 AM  57 year old male with hx of HTN on lisinopril BIB EMS from work for evaluation of near syncope.  Pt report he felt fine yesterday and this morning when he arrive at work.  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  He went out to his car to rest for a bit, went back to work and shortly later he felt lightheadedness again and had a near syncope.  Coworker contact EMS and pt was brought here.  EMR report pt was hypotensive, improves with IVF.  Initial CBG >100.  Pt denies any headache, neck pain, cp, heart palpitation, sob, abd pain, focal numbness or focal weakness.  Admits to tobacco use, half a pack a day and alcohol use, 6-8 beers daily.  Last alcohol use was last night.  Does not normally eat breakfast. Report improvement with IVF.   On exam pt is laying flat in no acute discomfort.  Heart with RRR, lungs CTAB, adomen soft and nontender, 5/5 strength to all 4 extremities, no neuro deficit, mentating appropriately.  No signs of trauma  -Labs  ordered, independently viewed and interpreted by me.  Labs remarkable for Cr 1.28, worse than baseline. WBC 22.6 likely stress demargination -The patient was maintained on a cardiac monitor.  I personally viewed  and interpreted the cardiac monitored which showed an underlying rhythm of: NSR -Imaging including head CT considered but not performed as pt without head injury -This patient presents to the ED for concern of near syncope, this involves an extensive number of treatment options, and is a complaint that carries with it a high risk of complications and morbidity.  The differential diagnosis includes dehydration, anemia, electrolytes derangement, cardiac arrhythmia, infection, stroke, MI -Co morbidities that complicate the patient evaluation includes alcohol abuse -Treatment includes IVF, ASA -Reevaluation of the patient after these medicines showed that the patient improved -PCP office notes or outside notes reviewed -Escalation to admission/observation considered: patients feels much better, is comfortable with discharge, and will follow up with PCP -Prescription medication considered, patient comfortable with hydration -Social Determinant of Health considered which includes alcohol abuse  3:03 PM On exam pt report feeling better after IVF.  Work up remarkable for dehydration.  Encourage alcohol avoidance but otherwise pt stable for discharge.          Final Clinical Impression(s) / ED Diagnoses Final diagnoses:  Near syncope  Dehydration    Rx / DC Orders ED Discharge Orders          Ordered    Ambulatory referral to Cardiology       Comments: If you have not heard from the Cardiology office within the next 72 hours please call [phone].   08/30/22 1516              Fayrene Helper, PA-C 08/30/22 1524    Pricilla Loveless, MD 09/02/22 [phone]

## 2022-08-30 NOTE — Discharge Instructions (Addendum)
You have been evaluated for your symptoms.  Your near passing out episode is likely due to dehydration.  Stay hydrated.  Avoid heavy alcohol use.  Follow up with cardiologist to ensure your episode isn't related to your heart.  Return if you have any concerns.

## 2022-11-10 NOTE — Progress Notes (Deleted)
Cardiology Office Note:    Date:  11/10/2022   ID:  FAHIM KATS, DOB 16-Feb-1965, MRN [id number]  PCP:  Renford Dills, MD   [facility] Providers Cardiologist:  None { Click to update primary MD,subspecialty MD or APP then REFRESH:1}    Referring MD: Renford Dills, MD   No chief complaint on file. ***  History of Present Illness:    Xavier Cooper is a 57 y.o. male with a hx of asthma, HTN, HLD who is referred to cardiology for near syncopal episode in July.  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 He went out to his car to rest for a bit, went back to work and shortly later he felt lightheadedness again and had a near syncope. Coworker contact EMS and pt was brought here. EMR report pt was hypotensive, improves with IVF.  He drinks 6-8 beers daily. He also didn't eat breakfast. He had no arrhythmia. No ACS. Past Medical History:  Diagnosis Date   Asthma    Cataract    GERD (gastroesophageal reflux disease)    Hyperlipidemia    Hypertension    Inguinal hernia    bilateral    Past Surgical History:  Procedure Laterality Date   CATARACT EXTRACTION     HERNIA REPAIR     inguinal bilateral   TONSILLECTOMY     UMBILICAL HERNIA REPAIR  12/01/2009   recurrent left inguinal hernia    Current Medications: ***  Allergies:   Patient has no known allergies.   Social History   Socioeconomic History   Marital status: Married    Spouse name: Not on file   Number of children: Not on file   Years of education: Not on file   Highest education level: Not on file  Occupational History   Not on file  Tobacco Use   Smoking status: Every Day    Current packs/day: 0.50    Types: Cigarettes   Smokeless tobacco: Never  Substance and Sexual Activity   Alcohol use: Yes    Alcohol/week: 6.0 standard drinks of alcohol    Types: 6 Cans of beer per week    Comment: 6-7 beers per day    Drug  use: Not Currently    Types: Marijuana   Sexual activity: Not on file  Other Topics Concern   Not on file  Social History Narrative   Not on file   Social Determinants of Health   Financial Resource Strain: Not on file  Food Insecurity: Not on file  Transportation Needs: Not on file  Physical Activity: Not on file  Stress: Not on file  Social Connections: Unknown (06/25/2021)   [facility]   Social Network    Social Network: Not on file     Family History: The patient's ***family history is not on file.  ROS:   Please see the history of present illness.    *** All other systems reviewed and are negative.  EKGs/Labs/Other Studies Reviewed:    The following studies were reviewed today: ***      Recent Labs: 08/30/2022: BUN 16; Creatinine, Ser 1.28; Hemoglobin 15.2; Magnesium 1.8; Platelets 348; Potassium 4.4; Sodium 132; TSH 1.713  Recent Lipid Panel No results found for: "CHOL", "TRIG", "HDL", "CHOLHDL", "VLDL", "LDLCALC", "LDLDIRECT"   Risk Assessment/Calculations:   {Does this patient have ATRIAL FIBRILLATION?[phone]}  No BP recorded.  {Refresh Note OR Click here  to enter BP  :1}***         Physical Exam:    VS:  There were no vitals taken for this visit.    Wt Readings from Last 3 Encounters:  08/30/22 190 lb (86.2 kg)  11/23/18 180 lb (81.6 kg)  05/09/18 175 lb (79.4 kg)     GEN: *** Well nourished, well developed in no acute distress HEENT: Normal NECK: No JVD; No carotid bruits LYMPHATICS: No lymphadenopathy CARDIAC: ***RRR, no murmurs, rubs, gallops RESPIRATORY:  Clear to auscultation without rales, wheezing or rhonchi  ABDOMEN: Soft, non-tender, non-distended MUSCULOSKELETAL:  No edema; No deformity  SKIN: Warm and dry NEUROLOGIC:  Alert and oriented x 3 PSYCHIATRIC:  Normal affect   ASSESSMENT:    Near syncope -likely in the setting of dehydration, not eating, and etoh - low suspicion for a cardiac  cause PLAN:    In order of problems listed above:  No further cardiac w/u indicated at this time      {Are you ordering a CV Procedure (e.g. stress test, cath, DCCV, TEE, etc)?   Press F2        :[id number]}    Medication Adjustments/Labs and Tests Ordered: Current medicines are reviewed at length with the patient today.  Concerns regarding medicines are outlined above.  No orders of the defined types were placed in this encounter.  No orders of the defined types were placed in this encounter.   There are no Patient Instructions on file for this visit.   Signed, Maisie Fus, MD  11/10/2022 5:21 PM    [facility]

## 2022-11-11 ENCOUNTER — Ambulatory Visit: Payer: BLUE CROSS/BLUE SHIELD | Attending: Internal Medicine | Admitting: Internal Medicine
